# Patient Record
Sex: Female | Born: 1980 | Race: White | Hispanic: No | Smoking: Current every day smoker
Health system: Southern US, Community
[De-identification: ages and names within clinical notes are randomized; demographics above are authoritative.]

## PROBLEM LIST (undated history)

## (undated) DIAGNOSIS — B019 Varicella without complication: Secondary | ICD-10-CM

## (undated) DIAGNOSIS — G43909 Migraine, unspecified, not intractable, without status migrainosus: Secondary | ICD-10-CM

## (undated) DIAGNOSIS — Z1371 Encounter for nonprocreative screening for genetic disease carrier status: Secondary | ICD-10-CM

## (undated) DIAGNOSIS — Z8041 Family history of malignant neoplasm of ovary: Secondary | ICD-10-CM

## (undated) DIAGNOSIS — R011 Cardiac murmur, unspecified: Secondary | ICD-10-CM

## (undated) DIAGNOSIS — Z8481 Family history of carrier of genetic disease: Secondary | ICD-10-CM

## (undated) HISTORY — PX: TUBAL LIGATION: SHX77

## (undated) HISTORY — DX: Cardiac murmur, unspecified: R01.1

## (undated) HISTORY — DX: Varicella without complication: B01.9

## (undated) HISTORY — DX: Migraine, unspecified, not intractable, without status migrainosus: G43.909

## (undated) HISTORY — DX: Family history of malignant neoplasm of ovary: Z80.41

## (undated) HISTORY — DX: Family history of carrier of genetic disease: Z84.81

---
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
## 2004-11-11 ENCOUNTER — Observation Stay: Payer: Self-pay | Admitting: Obstetrics & Gynecology

## 2005-01-03 ENCOUNTER — Inpatient Hospital Stay: Payer: Self-pay

## 2005-01-03 HISTORY — PX: TUBAL LIGATION: SHX77

## 2011-06-01 ENCOUNTER — Emergency Department: Payer: Self-pay | Admitting: *Deleted

## 2012-02-27 ENCOUNTER — Emergency Department: Payer: Self-pay | Admitting: Emergency Medicine

## 2012-02-27 LAB — URINALYSIS, COMPLETE
Ketone: NEGATIVE
Ph: 5 (ref 4.5–8.0)
Protein: 30
RBC,UR: 5 /HPF (ref 0–5)
Specific Gravity: 1.023 (ref 1.003–1.030)
Squamous Epithelial: 9

## 2012-03-12 ENCOUNTER — Emergency Department: Payer: Self-pay | Admitting: *Deleted

## 2012-11-21 ENCOUNTER — Emergency Department: Payer: Self-pay | Admitting: Emergency Medicine

## 2013-07-17 ENCOUNTER — Emergency Department: Payer: Self-pay | Admitting: Emergency Medicine

## 2013-10-12 ENCOUNTER — Emergency Department: Payer: Self-pay | Admitting: Internal Medicine

## 2013-10-12 LAB — COMPREHENSIVE METABOLIC PANEL
ALK PHOS: 52 U/L
AST: 23 U/L (ref 15–37)
Albumin: 4.4 g/dL (ref 3.4–5.0)
Anion Gap: 4 — ABNORMAL LOW (ref 7–16)
BILIRUBIN TOTAL: 0.7 mg/dL (ref 0.2–1.0)
BUN: 13 mg/dL (ref 7–18)
Calcium, Total: 8.8 mg/dL (ref 8.5–10.1)
Chloride: 105 mmol/L (ref 98–107)
Co2: 31 mmol/L (ref 21–32)
Creatinine: 0.53 mg/dL — ABNORMAL LOW (ref 0.60–1.30)
EGFR (African American): >60
EGFR (Non-African Amer.): >60
GLUCOSE: 92 mg/dL (ref 65–99)
OSMOLALITY: 279 (ref 275–301)
Potassium: 3.1 mmol/L — ABNORMAL LOW (ref 3.5–5.1)
SGPT (ALT): 16 U/L (ref 12–78)
SODIUM: 140 mmol/L (ref 136–145)
TOTAL PROTEIN: 7.7 g/dL (ref 6.4–8.2)

## 2013-10-12 LAB — CBC
HCT: 42.3 % (ref 35.0–47.0)
HGB: 14.2 g/dL (ref 12.0–16.0)
MCH: 30.1 pg (ref 26.0–34.0)
MCHC: 33.5 g/dL (ref 32.0–36.0)
MCV: 90 fL (ref 80–100)
Platelet: 175 10*3/uL (ref 150–440)
RBC: 4.71 10*6/uL (ref 3.80–5.20)
RDW: 13 % (ref 11.5–14.5)
WBC: 7.4 10*3/uL (ref 3.6–11.0)

## 2013-10-12 LAB — URINALYSIS, COMPLETE
BILIRUBIN, UR: NEGATIVE
Bacteria: NONE SEEN
Blood: NEGATIVE
GLUCOSE, UR: NEGATIVE mg/dL (ref 0–75)
Ketone: NEGATIVE
Leukocyte Esterase: NEGATIVE
NITRITE: NEGATIVE
PROTEIN: NEGATIVE
Ph: 6 (ref 4.5–8.0)
RBC,UR: 1 /HPF (ref 0–5)
SPECIFIC GRAVITY: 1.019 (ref 1.003–1.030)
Squamous Epithelial: 6

## 2013-10-12 LAB — LIPASE, BLOOD: Lipase: 118 U/L (ref 73–393)

## 2015-07-12 ENCOUNTER — Encounter: Payer: Self-pay | Admitting: Family Medicine

## 2015-07-12 ENCOUNTER — Ambulatory Visit (INDEPENDENT_AMBULATORY_CARE_PROVIDER_SITE_OTHER): Payer: BLUE CROSS/BLUE SHIELD | Admitting: Family Medicine

## 2015-07-12 VITALS — BP 102/64 | HR 84 | Temp 98.2°F | Ht 65.25 in | Wt 148.5 lb

## 2015-07-12 DIAGNOSIS — R5383 Other fatigue: Secondary | ICD-10-CM

## 2015-07-12 DIAGNOSIS — Z720 Tobacco use: Secondary | ICD-10-CM

## 2015-07-12 DIAGNOSIS — Z13220 Encounter for screening for lipoid disorders: Secondary | ICD-10-CM | POA: Diagnosis not present

## 2015-07-12 DIAGNOSIS — R1084 Generalized abdominal pain: Secondary | ICD-10-CM

## 2015-07-12 DIAGNOSIS — R109 Unspecified abdominal pain: Secondary | ICD-10-CM | POA: Insufficient documentation

## 2015-07-12 DIAGNOSIS — Z0000 Encounter for general adult medical examination without abnormal findings: Secondary | ICD-10-CM

## 2015-07-12 LAB — COMPREHENSIVE METABOLIC PANEL
ALBUMIN: 4.5 g/dL (ref 3.5–5.2)
ALT: 8 U/L (ref 0–35)
AST: 13 U/L (ref 0–37)
Alkaline Phosphatase: 39 U/L (ref 39–117)
BUN: 12 mg/dL (ref 6–23)
CALCIUM: 9.4 mg/dL (ref 8.4–10.5)
CHLORIDE: 106 meq/L (ref 96–112)
CO2: 29 mEq/L (ref 19–32)
Creatinine, Ser: 0.75 mg/dL (ref 0.40–1.20)
GFR: 94.92 mL/min (ref >60.00)
Glucose, Bld: 80 mg/dL (ref 70–99)
POTASSIUM: 4 meq/L (ref 3.5–5.1)
SODIUM: 141 meq/L (ref 135–145)
TOTAL PROTEIN: 7.1 g/dL (ref 6.0–8.3)
Total Bilirubin: 0.7 mg/dL (ref 0.2–1.2)

## 2015-07-12 LAB — CBC
HEMATOCRIT: 43.5 % (ref 36.0–46.0)
HEMOGLOBIN: 14.5 g/dL (ref 12.0–15.0)
MCHC: 33.3 g/dL (ref 30.0–36.0)
MCV: 90.5 fl (ref 78.0–100.0)
PLATELETS: 163 10*3/uL (ref 150.0–400.0)
RBC: 4.82 Mil/uL (ref 3.87–5.11)
RDW: 12.9 % (ref 11.5–15.5)
WBC: 5.5 10*3/uL (ref 4.0–10.5)

## 2015-07-12 LAB — TSH: TSH: 1.3 u[IU]/mL (ref 0.35–4.50)

## 2015-07-12 LAB — VITAMIN B12: VITAMIN B 12: 180 pg/mL — AB (ref 211–911)

## 2015-07-12 LAB — LIPID PANEL
CHOLESTEROL: 154 mg/dL (ref 0–200)
HDL: 40.3 mg/dL (ref >39.00)
LDL Cholesterol: 103 mg/dL — ABNORMAL HIGH (ref 0–99)
NonHDL: 113.48
TRIGLYCERIDES: 54 mg/dL (ref 0.0–149.0)
Total CHOL/HDL Ratio: 4
VLDL: 10.8 mg/dL (ref 0.0–40.0)

## 2015-07-12 MED ORDER — DICYCLOMINE HCL 20 MG PO TABS: 20.0000 mg | ORAL_TABLET | Freq: Four times a day (QID) | ORAL | Status: DC | PRN

## 2015-07-12 NOTE — Assessment & Plan Note (Signed)
Declines flu. Will obtain records regarding tetanus. Labs today. See orders. Pap smear up-to-date.

## 2015-07-12 NOTE — Assessment & Plan Note (Signed)
Unremarkable exam. No red flags. Obtaining labs to assess for organic pathology.

## 2015-07-12 NOTE — Assessment & Plan Note (Signed)
History consistent with IBS. Treating with dicyclomine.

## 2015-07-12 NOTE — Progress Notes (Signed)
Pre visit review using our clinic review tool, if applicable. No additional management support is needed unless otherwise documented below in the visit note. 

## 2015-07-12 NOTE — Patient Instructions (Signed)
Use the medication (Dicyclomine) as needed for your belly pain.  Use Miralax daily to help regulate your bowel movements. You may increase this to twice daily (or more) if needed to achieve a regular soft BM.  We will call with your lab results.  Follow up in 6 months to a year or earlier if needed.  Take care  Dr. Adriana Simas

## 2015-07-12 NOTE — Progress Notes (Signed)
Subjective:  Patient ID: Shelia [name], female    DOB: 04-13-1981  Age: 35 y.o. MRN: [id number]  CC: Establish care, Abdominal pain, Fatigue  HPI Shelia [name] is a 35 y.o. female presents to the clinic today to establish care.  Preventative Healthcare  Pap smear: Up to date; 2015 or 2016 (followed by Gyn).  Immunizations  Tetanus - Unsure of last tetanus. Will obtain records.  Pneumococcal - In need of given tobacco abuse.   Flu - Declines.  Labs: Labs today; see orders.  Alcohol use: No.  Smoking/tobacco use: Yes.   STD/HIV testing: Declines.  Abdominal pain  This is a chronic problem.  She's had generalized abdominal pain for over a year.  Pain is severe and intermittent. Pain is brief and occurs several times during the day.  She describes it as "contractions".  She reports that she has intermittent difficulty with constipation.  She has been evaluated for this previously and has had negative workup (Labs, CT, [place]).  No known exacerbating or relieving factors.  No associated fever, chills, weight loss.  Fatigue  Patient has noted significant fatigue the past month.  He states that she sleeps well but does not wake up feeling refreshed.  No new stressors.  She's unsure why she is tired all the time.  No exacerbating or relieving factors.  PMH, Surgical Hx, Family Hx, Social History reviewed and updated as below.  Past Medical History  Diagnosis Date  . Chicken pox   . Heart murmur     as a child.  Marland Kitchen Migraines    Past Surgical History  Procedure Laterality Date  . Tubal ligation     Family History  Problem Relation Age of Onset  . Alcohol abuse Father   . Hypertension Father   . Diabetes Maternal Aunt   . Diabetes Maternal Grandmother   . Hypertension Mother   . Ovarian cancer Paternal Grandmother    Social History  Substance Use Topics  . Smoking status: Current Every Day Smoker  . Smokeless tobacco: Never Used  .  Alcohol Use: No   Review of Systems  HENT: Positive for tinnitus.   Eyes: Positive for itching.  Gastrointestinal: Positive for nausea and abdominal pain.  Genitourinary: Positive for frequency.  Neurological: Positive for headaches.  Psychiatric/Behavioral:       Anxiety.   All other systems reviewed and are negative.  Objective:   Today's Vitals: BP 102/64 mmHg  Pulse 84  Temp(Src) 98.2 F (36.8 C) (Oral)  Ht 5' 5.25" (1.657 m)  Wt 148 lb 8 oz (67.359 kg)  BMI 24.53 kg/m2  SpO2 100%  LMP 07/02/2015  Physical Exam  Constitutional: She is oriented to person, place, and time. She appears well-developed and well-nourished. No distress.  HENT:  Head: Normocephalic and atraumatic.  Nose: Nose normal.  Mouth/Throat: Oropharynx is clear and moist. No oropharyngeal exudate.  Normal TM's bilaterally.   Eyes: Conjunctivae are normal. No scleral icterus.  Neck: Neck supple. No thyromegaly present.  Cardiovascular: Normal rate and regular rhythm.   Soft systolic murmur.  Pulmonary/Chest: Effort normal and breath sounds normal. She has no wheezes. She has no rales.  Abdominal: Soft. She exhibits no distension. There is no tenderness. There is no rebound and no guarding.  Musculoskeletal: Normal range of motion. She exhibits no edema.  Lymphadenopathy:    She has no cervical adenopathy.  Neurological: She is alert and oriented to person, place, and time.  Skin: Skin is warm and dry.  Psychiatric: She has a normal mood and affect.  Vitals reviewed.  Assessment & Plan:   Problem List Items Addressed This Visit    Abdominal pain    History consistent with IBS. Treating with dicyclomine.      Relevant Medications   dicyclomine (BENTYL) 20 MG tablet   Other Relevant Orders   CBC   Comp Met (CMET)   Fatigue    Unremarkable exam. No red flags. Obtaining labs to assess for organic pathology.      Relevant Orders   TSH   B12   Preventative health care - Primary     Declines flu. Will obtain records regarding tetanus. Labs today. See orders. Pap smear up-to-date.      Tobacco abuse    Other Visit Diagnoses    Screening, lipid        Relevant Orders    Lipid panel       Outpatient Encounter Prescriptions as of 07/12/2015  Medication Sig  . dicyclomine (BENTYL) 20 MG tablet Take 1 tablet (20 mg total) by mouth 4 (four) times daily as needed for spasms.   No facility-administered encounter medications on file as of 07/12/2015.    Follow-up: Return 6 months to 1 year or earlier if needed.  [place]

## 2015-07-22 ENCOUNTER — Telehealth: Payer: Self-pay | Admitting: Family Medicine

## 2015-07-22 NOTE — Telephone Encounter (Signed)
[id number]

## 2015-07-22 NOTE — Telephone Encounter (Signed)
Pt called to check the status for the lab work. Call pt @ [phone] Mon-Fri call between 1-2 and Friday call anytime. Thank you!

## 2015-07-26 ENCOUNTER — Ambulatory Visit (INDEPENDENT_AMBULATORY_CARE_PROVIDER_SITE_OTHER): Payer: BLUE CROSS/BLUE SHIELD

## 2015-07-26 ENCOUNTER — Telehealth: Payer: Self-pay

## 2015-07-26 DIAGNOSIS — E538 Deficiency of other specified B group vitamins: Secondary | ICD-10-CM | POA: Diagnosis not present

## 2015-07-26 MED ORDER — CYANOCOBALAMIN 1000 MCG/ML IJ SOLN
1000.0000 ug | Freq: Once | INTRAMUSCULAR | Status: AC
  Administered 2015-07-26: 1000 ug via INTRAMUSCULAR

## 2015-07-26 NOTE — Telephone Encounter (Signed)
Based off the history given, it sounds like a tension headache. Advise heat and Motrin 800 mg three times daily if needed.

## 2015-07-26 NOTE — Progress Notes (Signed)
Patient came in for B12 injection.  Received in Right deltoid.  Patient tolerated well.  

## 2015-07-26 NOTE — Telephone Encounter (Signed)
Reason for call: Headaches Symptoms: pt c/o headaches, pt states that they hit her at anytime of day and they are primarily start from the fron of her head and move to the back of head Duration: all day x1week Medications: tyelnol 400-800mg  once a day, advil  once a day Last seen for this problem: January Seen by: Dr. Adriana Simas  Pt wants to know if she can get script for something to help with the headaches. Pt is coming in for B12 injection at 2:00pm. Please advise thanks

## 2015-07-26 NOTE — Telephone Encounter (Signed)
Tanya notified pt of Dr.Cook's comments

## 2015-08-02 ENCOUNTER — Ambulatory Visit (INDEPENDENT_AMBULATORY_CARE_PROVIDER_SITE_OTHER): Payer: BLUE CROSS/BLUE SHIELD

## 2015-08-02 DIAGNOSIS — E538 Deficiency of other specified B group vitamins: Secondary | ICD-10-CM

## 2015-08-02 MED ORDER — CYANOCOBALAMIN 1000 MCG/ML IJ SOLN
1000.0000 ug | Freq: Once | INTRAMUSCULAR | Status: AC
  Administered 2015-08-02: 1000 ug via INTRAMUSCULAR

## 2015-08-02 NOTE — Progress Notes (Signed)
Patient came in for B12 injection.  Received in Left deltoid.  Patient tolerated well.  

## 2015-08-09 ENCOUNTER — Ambulatory Visit (INDEPENDENT_AMBULATORY_CARE_PROVIDER_SITE_OTHER): Payer: BLUE CROSS/BLUE SHIELD

## 2015-08-09 DIAGNOSIS — E538 Deficiency of other specified B group vitamins: Secondary | ICD-10-CM

## 2015-08-09 MED ORDER — CYANOCOBALAMIN 1000 MCG/ML IJ SOLN
1000.0000 ug | Freq: Once | INTRAMUSCULAR | Status: AC
  Administered 2015-08-09: 1000 ug via INTRAMUSCULAR

## 2015-08-09 NOTE — Progress Notes (Signed)
Patient came in for b12 injection.  Received in her right deltoid.  Patient tolerated well.

## 2015-08-30 ENCOUNTER — Encounter: Payer: Self-pay | Admitting: Internal Medicine

## 2015-08-30 ENCOUNTER — Ambulatory Visit (INDEPENDENT_AMBULATORY_CARE_PROVIDER_SITE_OTHER): Payer: BLUE CROSS/BLUE SHIELD | Admitting: Family Medicine

## 2015-08-30 DIAGNOSIS — J020 Streptococcal pharyngitis: Secondary | ICD-10-CM | POA: Diagnosis not present

## 2015-08-30 LAB — POCT RAPID STREP A (OFFICE): Rapid Strep A Screen: POSITIVE — AB

## 2015-08-30 MED ORDER — AMOXICILLIN 500 MG PO CAPS: 500.0000 mg | ORAL_CAPSULE | Freq: Two times a day (BID) | ORAL | Status: DC

## 2015-08-30 NOTE — Patient Instructions (Addendum)
This is viral.  Use OTC tylenol and/or ibuprofen for discomfort.  Increase fluid intake.  Take the medication as prescribed.  Follow up as needed  Take care  Dr. Adriana Simasook   Pharyngitis Pharyngitis is redness, pain, and swelling (inflammation) of your pharynx.  CAUSES  Pharyngitis is usually caused by infection. Most of the time, these infections are from viruses (viral) and are part of a cold. However, sometimes pharyngitis is caused by bacteria (bacterial). Pharyngitis can also be caused by allergies. Viral pharyngitis may be spread from person to person by coughing, sneezing, and personal items or utensils (cups, forks, spoons, toothbrushes). Bacterial pharyngitis may be spread from person to person by more intimate contact, such as kissing.  SIGNS AND SYMPTOMS  Symptoms of pharyngitis include:   Sore throat.   Tiredness (fatigue).   Low-grade fever.   Headache.  Joint pain and muscle aches.  Skin rashes.  Swollen lymph nodes.  Plaque-like film on throat or tonsils (often seen with bacterial pharyngitis). DIAGNOSIS  Your health care provider will ask you questions about your illness and your symptoms. Your medical history, along with a physical exam, is often all that is needed to diagnose pharyngitis. Sometimes, a rapid strep test is done. Other lab tests may also be done, depending on the suspected cause.  TREATMENT  Viral pharyngitis will usually get better in 3-4 days without the use of medicine. Bacterial pharyngitis is treated with medicines that kill germs (antibiotics).  HOME CARE INSTRUCTIONS   Drink enough water and fluids to keep your urine clear or pale yellow.   Only take over-the-counter or prescription medicines as directed by your health care provider:   If you are prescribed antibiotics, make sure you finish them even if you start to feel better.   Do not take aspirin.   Get lots of rest.   Gargle with 8 oz of salt water ( tsp of salt per 1  qt of water) as often as every 1-2 hours to soothe your throat.   Throat lozenges (if you are not at risk for choking) or sprays may be used to soothe your throat. SEEK MEDICAL CARE IF:   You have large, tender lumps in your neck.  You have a rash.  You cough up green, yellow-brown, or bloody spit. SEEK IMMEDIATE MEDICAL CARE IF:   Your neck becomes stiff.  You drool or are unable to swallow liquids.  You vomit or are unable to keep medicines or liquids down.  You have severe pain that does not go away with the use of recommended medicines.  You have trouble breathing (not caused by a stuffy nose). MAKE SURE YOU:   Understand these instructions.  Will watch your condition.  Will get help right away if you are not doing well or get worse.   This information is not intended to replace advice given to you by your health care provider. Make sure you discuss any questions you have with your health care provider.   Document Released: 06/01/2005 Document Revised: 03/22/2013 Document Reviewed: 02/06/2013 Elsevier Interactive Patient Education [employer]2016 Elsevier Inc.

## 2015-08-30 NOTE — Progress Notes (Signed)
Pre visit review using our clinic review tool, if applicable. No additional management support is needed unless otherwise documented below in the visit note. 

## 2015-08-30 NOTE — Progress Notes (Signed)
   Subjective:  Patient ID: Shelia [name], female    DOB: 01/29/1981  Age: 35 y.o. MRN: [id number]  CC: Sore throat, runny nose, sneezing  HPI:  35 year old female presents to clinic today with the above complaints.  Patient states that she's not been feeling well since Sunday. She been experiencing sore throat, sneezing, runny nose, and rash. She currently does not have a rash. Sore throat has improved slightly but still remains problematic. No known exacerbating or relieving factors. No associated fevers or chills. She has had recent sick contacts as her son has had influenza she's also been exposed to strep throat. She is concerned she may have strep throat.  Social Hx   Social History   Social History  . Marital Status: Single    Spouse Name: N/A  . Number of Children: N/A  . Years of Education: N/A   Social History Main Topics  . Smoking status: Current Every Day Smoker  . Smokeless tobacco: Never Used  . Alcohol Use: No  . Drug Use: No  . Sexual Activity:    Partners: Male   Other Topics Concern  . None   Social History Narrative   Review of Systems  Constitutional: Negative.   HENT: Positive for rhinorrhea, sneezing and sore throat.    Objective:  BP 111/76 mmHg  Pulse 90  Temp(Src) 98.1 F (36.7 C) (Oral)  Ht 5' 5.25" (1.657 m)  Wt 151 lb 6 oz (68.663 kg)  BMI 25.01 kg/m2  SpO2 100%  BP/Weight 08/30/2015 07/12/2015  Systolic BP 111 102  Diastolic BP 76 64  Wt. (Lbs) 151.38 148.5  BMI 25.01 24.53   Physical Exam  Constitutional: She is oriented to person, place, and time. She appears well-developed. No distress.  HENT:  Head: Normocephalic and atraumatic.  Oropharynx with mild erythema. No exudate. Normal TM's bilaterally.   Cardiovascular: Normal rate and regular rhythm.   Pulmonary/Chest: Effort normal and breath sounds normal. No respiratory distress. She has no wheezes. She has no rales.  Neurological: She is alert and oriented to person,  place, and time.  Skin: Skin is warm and dry. No rash noted.  Psychiatric: She has a normal mood and affect.  Vitals reviewed.  Lab Results  Component Value Date   WBC 5.5 07/12/2015   HGB 14.5 07/12/2015   HCT 43.5 07/12/2015   PLT 163.0 07/12/2015   GLUCOSE 80 07/12/2015   CHOL 154 07/12/2015   TRIG 54.0 07/12/2015   HDL 40.30 07/12/2015   LDLCALC 103* 07/12/2015   ALT 8 07/12/2015   AST 13 07/12/2015   NA 141 07/12/2015   K 4.0 07/12/2015   CL 106 07/12/2015   CREATININE 0.75 07/12/2015   BUN 12 07/12/2015   CO2 29 07/12/2015   TSH 1.30 07/12/2015    Assessment & Plan:   Problem List Items Addressed This Visit    Strep pharyngitis    New problem. Rapid strep positive today. Treating with amoxicillin.      Relevant Medications   amoxicillin (AMOXIL) 500 MG capsule   Other Relevant Orders   POCT rapid strep A (Completed)      Meds ordered this encounter  Medications  . amoxicillin (AMOXIL) 500 MG capsule    Sig: Take 1 capsule (500 mg total) by mouth 2 (two) times daily.    Dispense:  20 capsule    Refill:  0   Follow-up: PRN  Everlene OtherJayce Jonai Weyland DO [facility] [place]

## 2015-08-30 NOTE — Assessment & Plan Note (Signed)
New problem. Rapid strep positive today. Treating with amoxicillin. 

## 2015-09-06 ENCOUNTER — Ambulatory Visit (INDEPENDENT_AMBULATORY_CARE_PROVIDER_SITE_OTHER): Payer: BLUE CROSS/BLUE SHIELD

## 2015-09-06 DIAGNOSIS — E538 Deficiency of other specified B group vitamins: Secondary | ICD-10-CM | POA: Diagnosis not present

## 2015-09-06 MED ORDER — CYANOCOBALAMIN 1000 MCG/ML IJ SOLN
1000.0000 ug | Freq: Once | INTRAMUSCULAR | Status: AC
  Administered 2015-09-06: 1000 ug via INTRAMUSCULAR

## 2015-09-06 NOTE — Progress Notes (Signed)
Patient came in for b12 injection.  Patient received in Left deltoid.  Patient tolerated well.  

## 2015-10-30 ENCOUNTER — Telehealth: Payer: Self-pay | Admitting: Family Medicine

## 2015-10-30 NOTE — Telephone Encounter (Signed)
Pt called wanting to get a B12 injection on Friday. Pt states she can be reached Between 1-2 or after 5. @ [phone]. Thank you!

## 2015-10-30 NOTE — Telephone Encounter (Signed)
Tried to call her today, unable to leave a message, I scheduled her for Friday in a random slot, please get time from her so I can update it. thanks

## 2015-11-01 ENCOUNTER — Ambulatory Visit (INDEPENDENT_AMBULATORY_CARE_PROVIDER_SITE_OTHER): Payer: BLUE CROSS/BLUE SHIELD

## 2015-11-01 DIAGNOSIS — Z23 Encounter for immunization: Secondary | ICD-10-CM

## 2015-11-01 DIAGNOSIS — E538 Deficiency of other specified B group vitamins: Secondary | ICD-10-CM

## 2015-11-01 MED ORDER — CYANOCOBALAMIN 1000 MCG/ML IJ SOLN
1000.0000 ug | Freq: Once | INTRAMUSCULAR | Status: AC
  Administered 2015-11-01: 1000 ug via INTRAMUSCULAR

## 2015-11-01 MED ORDER — TETANUS-DIPHTH-ACELL PERTUSSIS 5-2.5-18.5 LF-MCG/0.5 IM SUSP
0.5000 mL | Freq: Once | INTRAMUSCULAR | Status: AC
  Administered 2015-11-01: 0.5 mL via INTRAMUSCULAR

## 2015-11-01 NOTE — Progress Notes (Signed)
Patient came in for B12 and a tdap injection.  Received  tdap in left deltoid, b12 in right deltoid.  Patient tolerated well.

## 2015-12-06 ENCOUNTER — Ambulatory Visit: Payer: BLUE CROSS/BLUE SHIELD

## 2016-08-08 ENCOUNTER — Encounter: Payer: Self-pay | Admitting: Emergency Medicine

## 2016-08-08 ENCOUNTER — Emergency Department
Admission: EM | Admit: 2016-08-08 | Discharge: 2016-08-08 | Disposition: A | Discharge status: Home / Self Care | Payer: BLUE CROSS/BLUE SHIELD | Attending: Emergency Medicine | Admitting: Emergency Medicine

## 2016-08-08 ENCOUNTER — Emergency Department: Payer: BLUE CROSS/BLUE SHIELD

## 2016-08-08 DIAGNOSIS — N390 Urinary tract infection, site not specified: Secondary | ICD-10-CM | POA: Insufficient documentation

## 2016-08-08 DIAGNOSIS — R319 Hematuria, unspecified: Secondary | ICD-10-CM

## 2016-08-08 DIAGNOSIS — F17200 Nicotine dependence, unspecified, uncomplicated: Secondary | ICD-10-CM | POA: Insufficient documentation

## 2016-08-08 DIAGNOSIS — N23 Unspecified renal colic: Secondary | ICD-10-CM | POA: Insufficient documentation

## 2016-08-08 DIAGNOSIS — Z79899 Other long term (current) drug therapy: Secondary | ICD-10-CM | POA: Insufficient documentation

## 2016-08-08 DIAGNOSIS — N200 Calculus of kidney: Secondary | ICD-10-CM | POA: Insufficient documentation

## 2016-08-08 LAB — BASIC METABOLIC PANEL
Anion gap: 7 (ref 5–15)
BUN: 16 mg/dL (ref 6–20)
CO2: 27 mmol/L (ref 22–32)
CREATININE: 0.74 mg/dL (ref 0.44–1.00)
Calcium: 8.9 mg/dL (ref 8.9–10.3)
Chloride: 105 mmol/L (ref 101–111)
GFR calc Af Amer: >60 mL/min (ref >60)
GLUCOSE: 111 mg/dL — AB (ref 65–99)
Potassium: 3.5 mmol/L (ref 3.5–5.1)
Sodium: 139 mmol/L (ref 135–145)

## 2016-08-08 LAB — CBC
HCT: 40.3 % (ref 35.0–47.0)
Hemoglobin: 14 g/dL (ref 12.0–16.0)
MCH: 30.6 pg (ref 26.0–34.0)
MCHC: 34.8 g/dL (ref 32.0–36.0)
MCV: 87.9 fL (ref 80.0–100.0)
PLATELETS: 199 10*3/uL (ref 150–440)
RBC: 4.58 MIL/uL (ref 3.80–5.20)
RDW: 13 % (ref 11.5–14.5)
WBC: 9.5 10*3/uL (ref 3.6–11.0)

## 2016-08-08 LAB — URINALYSIS, ROUTINE W REFLEX MICROSCOPIC
Bilirubin Urine: NEGATIVE
Glucose, UA: NEGATIVE mg/dL
Ketones, ur: NEGATIVE mg/dL
Leukocytes, UA: NEGATIVE
Nitrite: NEGATIVE
PH: 9 — AB (ref 5.0–8.0)
Protein, ur: NEGATIVE mg/dL
SPECIFIC GRAVITY, URINE: 1.017 (ref 1.005–1.030)
WBC, UA: NONE SEEN WBC/hpf (ref 0–5)

## 2016-08-08 LAB — POCT PREGNANCY, URINE: Preg Test, Ur: NEGATIVE

## 2016-08-08 MED ORDER — KETOROLAC TROMETHAMINE 30 MG/ML IJ SOLN
30.0000 mg | Freq: Once | INTRAMUSCULAR | Status: AC
  Administered 2016-08-08: 30 mg via INTRAVENOUS
  Filled 2016-08-08: qty 1

## 2016-08-08 MED ORDER — CEPHALEXIN 500 MG PO CAPS: 500.0000 mg | ORAL_CAPSULE | Freq: Two times a day (BID) | ORAL | 0 refills | Status: DC

## 2016-08-08 MED ORDER — DEXTROSE 5 % IV SOLN: 1.0000 g | Freq: Once | INTRAVENOUS | Status: DC

## 2016-08-08 MED ORDER — CEFTRIAXONE SODIUM-DEXTROSE 1-3.74 GM-% IV SOLR
INTRAVENOUS | Status: AC
  Administered 2016-08-08: 1000 mg
  Filled 2016-08-08: qty 50

## 2016-08-08 MED ORDER — CEFTRIAXONE SODIUM-DEXTROSE 1-3.74 GM-% IV SOLR
1.0000 g | Freq: Once | INTRAVENOUS | Status: AC
  Administered 2016-08-08: 1 g via INTRAVENOUS

## 2016-08-08 NOTE — ED Triage Notes (Signed)
Pt to ed with c/o abd pain and lower back pain, acute onset.

## 2016-08-08 NOTE — ED Triage Notes (Signed)
C/o left flank and LLQ pain. Has had nausea and diarrhea. Denies fevers. Denies hematuria. Appears in pain

## 2016-08-08 NOTE — ED Provider Notes (Signed)
[facility] Emergency Department Provider Note ____________________________________________   I have reviewed the triage vital signs and the triage nursing note.  HISTORY  Chief Complaint Abdominal Pain   Historian Patient  HPI Shelia [name] is a 36 y.o. female presenting with an hour and a half of acute onset left flank pain radiating into the left lower quadrant. Denies frequency of urination or hematuria. Pain was severe. Just prior to my walking in to see the patient, pain was starting to ease off, and she just received a verbal order of probable completed, and patient is now almost pain-free.  Denies any new vaginal discharge. Denies any pelvic or abdominal pain, other than the radiation at the time when the pain was at its worst. No prior history of kidney stones, nor prior history of pain like this. States that she had a ovarian cyst at one point in the past, but doesn't feel like this.    Past Medical History:  Diagnosis Date  . Chicken pox   . Heart murmur    as a child.  . Migraines     Patient Active Problem List   Diagnosis Date Noted  . Strep pharyngitis 08/30/2015  . Preventative health care 07/12/2015  . Abdominal pain 07/12/2015  . Tobacco abuse 07/12/2015  . Fatigue 07/12/2015    Past Surgical History:  Procedure Laterality Date  . TUBAL LIGATION      Prior to Admission medications   Medication Sig Start Date End Date Taking? Authorizing Provider  cephALEXin (KEFLEX) 500 MG capsule Take 1 capsule (500 mg total) by mouth 2 (two) times daily. 08/08/16   Governor Rooks, MD  dicyclomine (BENTYL) 20 MG tablet Take 1 tablet (20 mg total) by mouth 4 (four) times daily as needed for spasms. Patient not taking: Reported on 08/08/2016 07/12/15   Tommie Sams, DO    Allergies  Allergen Reactions  . Other     Tingling with seafood  . Metronidazole Rash    Family History  Problem Relation Age of Onset  . Alcohol abuse Father   .  Hypertension Father   . Diabetes Maternal Aunt   . Diabetes Maternal Grandmother   . Hypertension Mother   . Ovarian cancer Paternal Grandmother     Social History Social History  Substance Use Topics  . Smoking status: Current Every Day Smoker  . Smokeless tobacco: Never Used  . Alcohol use No    Review of Systems  Constitutional: Negative for fever. Eyes: Negative for visual changes. ENT: Negative for sore throat. Cardiovascular: Negative for chest pain. Respiratory: Negative for shortness of breath. Gastrointestinal: Negative for vomiting and diarrhea. Genitourinary: Negative for dysuria. Musculoskeletal: Negative for extremity pain. Skin: Negative for rash. Neurological: Negative for headache. 10 point Review of Systems otherwise negative ____________________________________________   PHYSICAL EXAM:  VITAL SIGNS: ED Triage Vitals  Enc Vitals Group     BP 08/08/16 1019 126/78     Pulse Rate 08/08/16 1019 82     Resp 08/08/16 1019 18     Temp 08/08/16 1019 98.9 F (37.2 C)     Temp Source 08/08/16 1019 Oral     SpO2 08/08/16 1019 100 %     Weight 08/08/16 1020 150 lb (68 kg)     Height 08/08/16 1020 5\' 5"  (1.651 m)     Head Circumference --      Peak Flow --      Pain Score 08/08/16 1020 9     Pain  Loc --      Pain Edu? --      Excl. in GC? --      Constitutional: Alert and oriented. Well appearing and in no distress. HEENT   Head: Normocephalic and atraumatic.      Eyes: Conjunctivae are normal. PERRL. Normal extraocular movements.      Ears:         Nose: No congestion/rhinnorhea.   Mouth/Throat: Mucous membranes are moist.   Neck: No stridor. Cardiovascular/Chest: Normal rate, regular rhythm.  No murmurs, rubs, or gallops. Respiratory: Normal respiratory effort without tachypnea nor retractions. Breath sounds are clear and equal bilaterally. No wheezes/rales/rhonchi. Gastrointestinal: Soft. No distention, no guarding, no rebound.  Nontender.  Genitourinary/rectal:Deferred Musculoskeletal: Nontender with normal range of motion in all extremities. No joint effusions.  No lower extremity tenderness.  No edema. Neurologic:  Normal speech and language. No gross or focal neurologic deficits are appreciated. Skin:  Skin is warm, dry and intact. No rash noted. Psychiatric: Mood and affect are normal. Speech and behavior are normal. Patient exhibits appropriate insight and judgment.   ____________________________________________  LABS (pertinent positives/negatives)  Labs Reviewed  BASIC METABOLIC PANEL - Abnormal; Notable for the following:       Result Value   Glucose, Bld 111 (*)    All other components within normal limits  URINALYSIS, ROUTINE W REFLEX MICROSCOPIC - Abnormal; Notable for the following:    Color, Urine YELLOW (*)    APPearance CLOUDY (*)    pH 9.0 (*)    Hgb urine dipstick SMALL (*)    Bacteria, UA MANY (*)    Squamous Epithelial / LPF 6-30 (*)    All other components within normal limits  URINE CULTURE  CBC  POC URINE PREG, ED  POCT PREGNANCY, URINE    ____________________________________________    EKG I, Governor Rooksebecca Gailen Venne, MD, the attending physician have personally viewed and interpreted all ECGs.  None ____________________________________________  RADIOLOGY All Xrays were viewed by me. Imaging interpreted by Radiologist.  CT stone study abdomen:  IMPRESSION: Minimal left perinephric stranding which may be related to infection or nonspecific inflammation. No evidence of hydronephrosis.  2.5 cm right ovarian cyst. __________________________________________  PROCEDURES  Procedure(s) performed: None  Critical Care performed: None  ____________________________________________   ED COURSE / ASSESSMENT AND PLAN  Pertinent labs & imaging results that were available during my care of the patient were reviewed by me and considered in my medical decision making (see chart for  details).   [name] came in with what sounds like of fairly severe left flank pain rating down to left lower quadrant. Based on the description clinically certainly sounds most like ureteral lithiasis/renal colic.  After Toradol, patient is much improved. I discussed with her the multiple ways to approach any further diagnostic investigation. We discussed risk and benefit of first time diagnosis of kidney stone via CT scan, weight against radiation exposure. We discussed the possibility of doing a renal ultrasound and watching and waiting given some improvement. Patient would like to proceed with CT scan.  Denies pelvic complaints, vaginal discharge, or tenderness on exam, and it certainly seems like PID or other pelvic etiology is less likely at this point in time. She is declining/deferring pelvic exam at this point in time which I think is reasonable.   CT scan does not show definitive hydro-or kidney stone, however there was perinephric stranding. When I questioned patient, she states that she did in fact urinated before she went to CT  scan. I am highly suspicious that she probably passed the kidney stone right as she got her Toradol, and then probably urinated out before she went to her CT scan.  In any case, given the possibility raised for a kidney infection, and bacteria on the urinalysis even though without white blood cells, leukocytes, or nitrites, I did discuss with the patient and she is to give one dose of Rocephin and send a urine culture. Patient will be sent home on Keflex.  We discussed that I would recommend a pelvic exam for further evaluation given there was not a certain determination based off of the rest of the workup although again my highest suspicion is kidney stone that is now passed.  Patient did not want to do a pelvic exam, and understands what to watch for. I think this is reasonable approach.    CONSULTATIONS:   None   Patient / Family / Caregiver informed  of clinical course, medical decision-making process, and agree with plan.   I discussed return precautions, follow-up instructions, and discharge instructions with patient and/or family.   ___________________________________________   FINAL CLINICAL IMPRESSION(S) / ED DIAGNOSES   Final diagnoses:  Ureteral colic  Urinary tract infection with hematuria, site unspecified  Kidney stone              Note: This dictation was prepared with Dragon dictation. Any transcriptional errors that result from this process are unintentional    Governor Rooks, MD 08/08/16 1306

## 2016-08-08 NOTE — Discharge Instructions (Signed)
You were evaluated for left-sided flank pain, and as we discussed, I'm suspicious that you passed a kidney stone while here in the emergency department. Your CT scan shows inflammation which could be due to a recently passed kidney stone, or possibly kidney/urinary tract infection. For the possibility of infection, you were given 24-hour dose of IV Rocephin in the emergency department. Please start the antibiotic tomorrow.  Return to the emergency room immediately for any worsening symptoms including pain, inability to urinate, vomiting, fever, dizziness or passing out, or any other symptoms concerning to you.

## 2016-08-10 LAB — URINE CULTURE

## 2017-03-30 IMAGING — CT CT RENAL STONE PROTOCOL
3 of 4 series · 8 of 46 positions shown, 15 images · non-contrast
Comparison: 10/12/2013 CT

CLINICAL DATA: 35-year-old female with acute left flank, abdominal
and pelvic pain.

EXAM:
CT ABDOMEN AND PELVIS WITHOUT CONTRAST
TECHNIQUE: Multidetector CT imaging of the abdomen and pelvis was performed
following the standard protocol without IV contrast.

[Series 5: lung bases · axial · 0.75mm/px · z∈[-20,+40]mm · 4 of 22 slices shown, 9 images]
[im 5/22  soft-tissue]
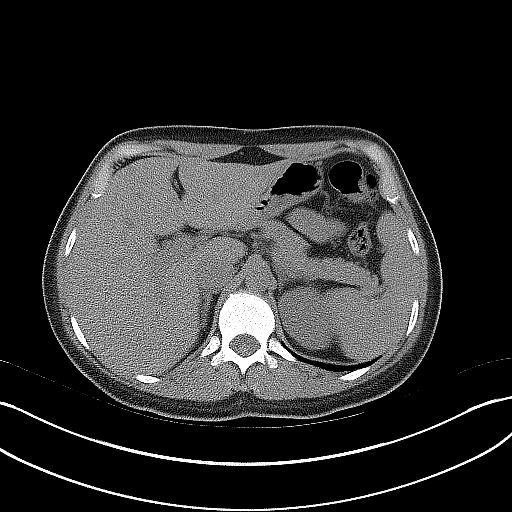
[im 5/22  lung]
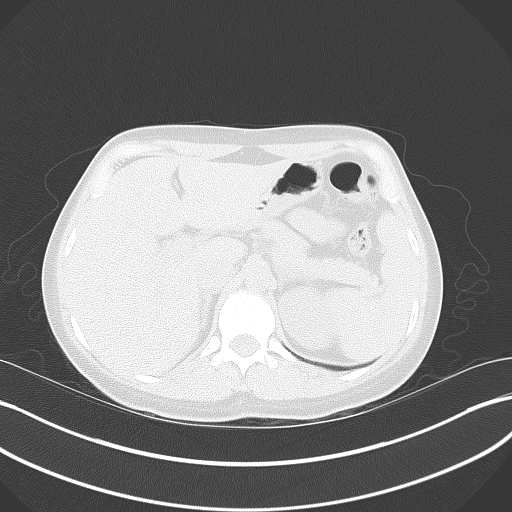
[im 5/22  bone]
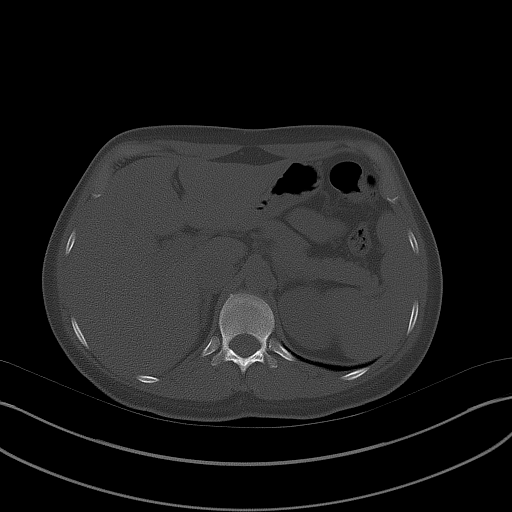
[im 9/22  soft-tissue]
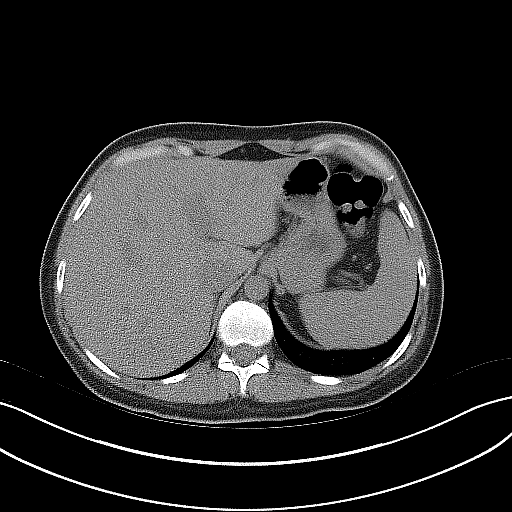
[im 9/22  lung]
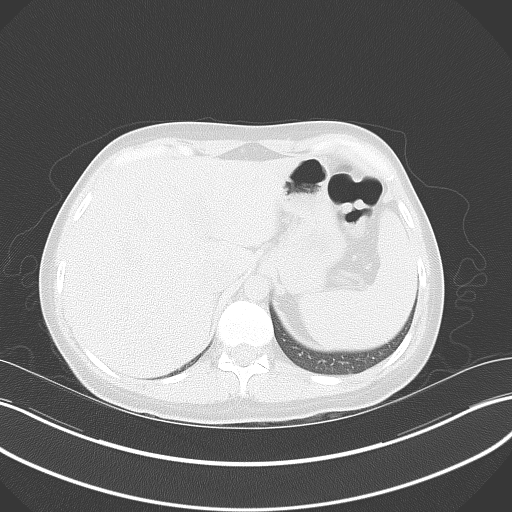
[im 13/22  soft-tissue]
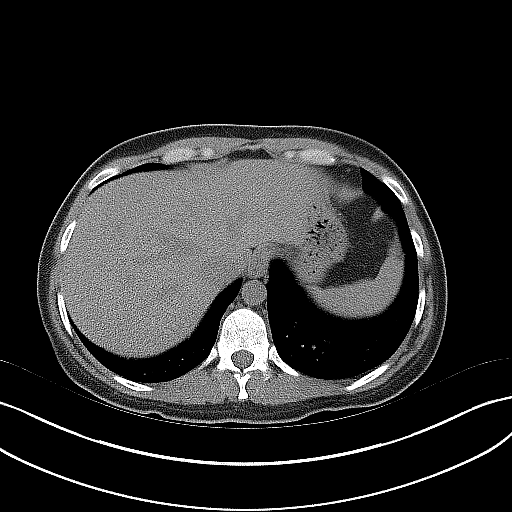
[im 13/22  lung]
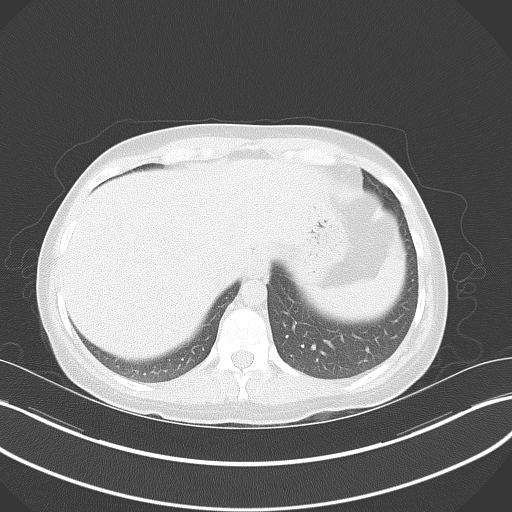
[im 17/22  soft-tissue]
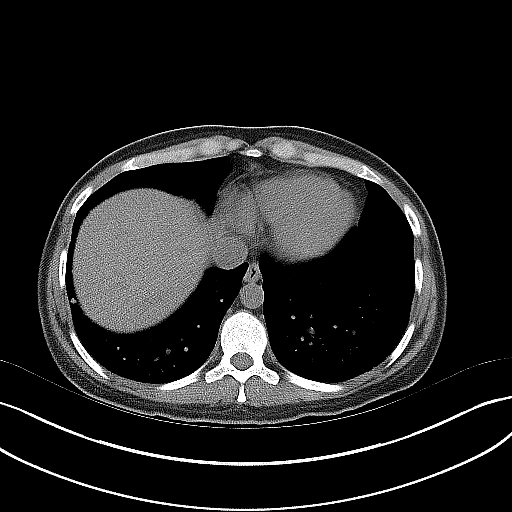
[im 17/22  lung]
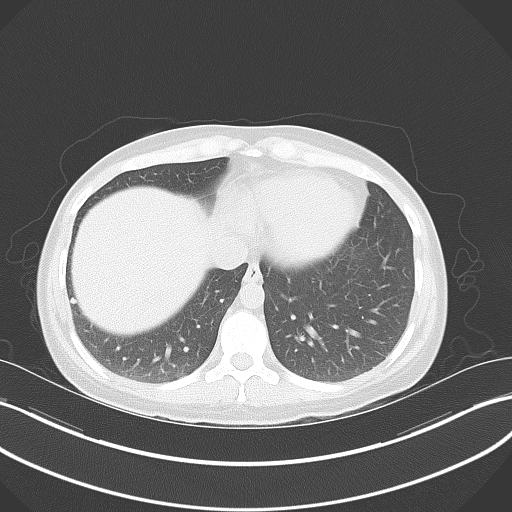

[Series 6: coronal · coronal · 0.76mm/px · 3 of 112 slices shown, 4 images]
[im 38/112  soft-tissue]
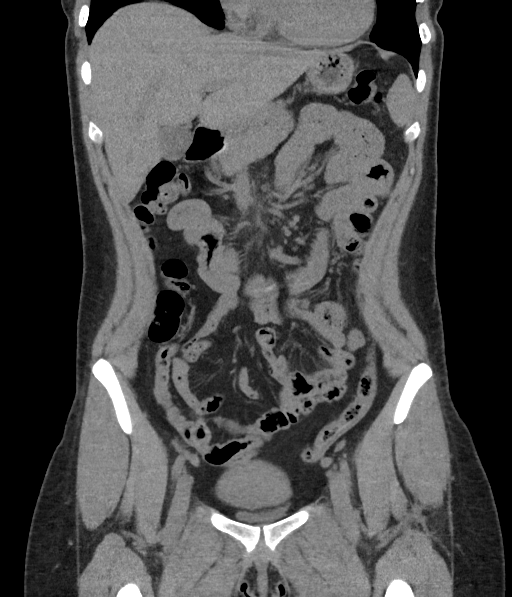
[im 50/112  soft-tissue]
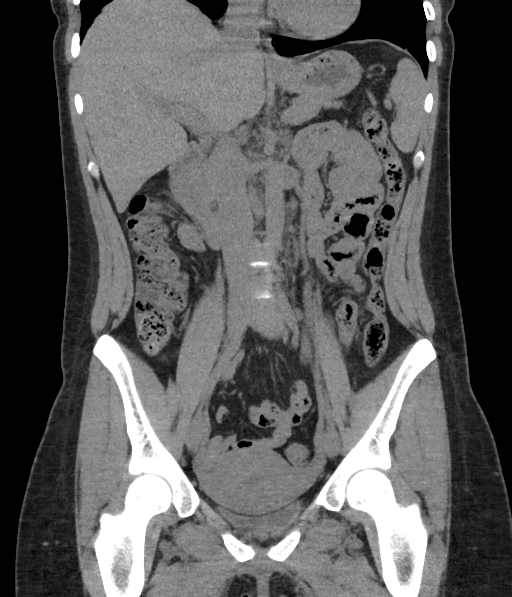
[im 50/112  bone]
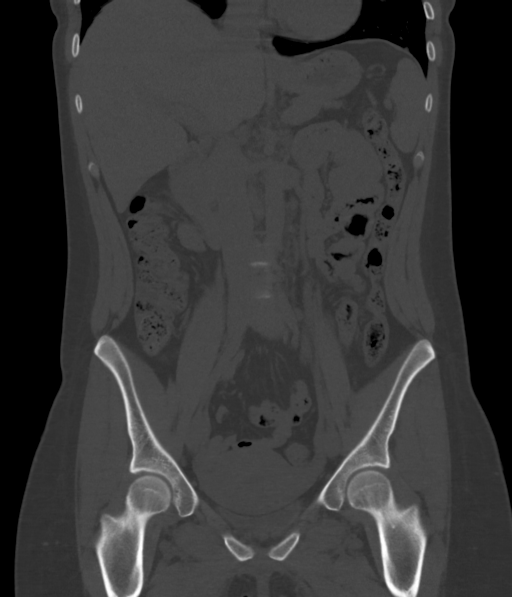
[im 62/112  soft-tissue]
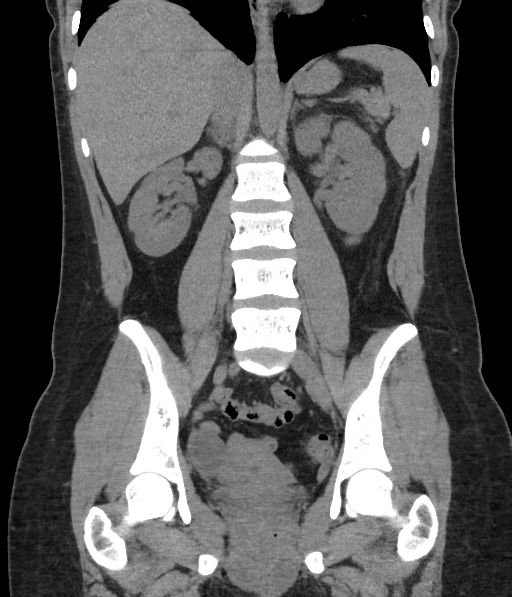

[Series 7: sagittal · sagittal · 0.46mm/px · 1 of 167 slices shown, 2 images]
[im 56/167  soft-tissue]
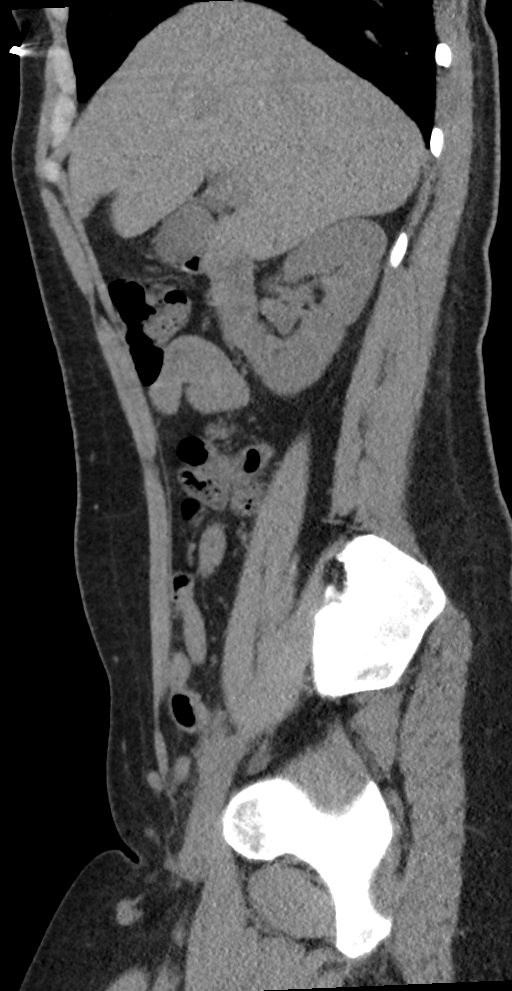
[im 56/167  bone]
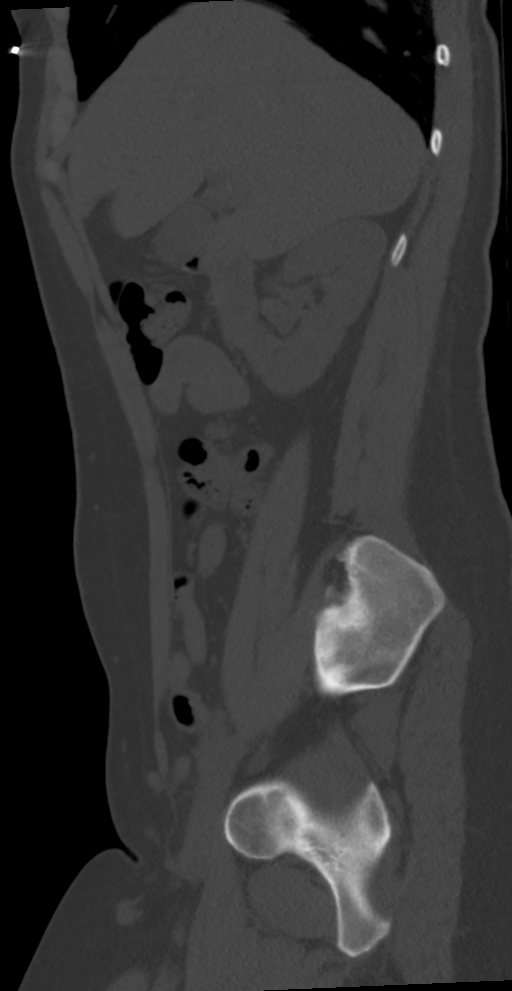

[8 of 46 positions shown; findings below may reference images not displayed]

FINDINGS: Please note that parenchymal abnormalities may be missed without
intravenous contrast.

Lower chest: No acute abnormality. A 4 mm nodule with the lateral
right lung base is unchanged.

Hepatobiliary: The liver and gallbladder are unremarkable. There is
no evidence of biliary dilatation.

Pancreas: Unremarkable

Spleen: Unremarkable

Adrenals/Urinary Tract: Minimal left perinephric stranding is
nonspecific but may be related to infection. The kidneys, adrenal
glands and bladder are otherwise unremarkable. There is no evidence
of hydronephrosis or obstructing urinary calculi.

Stomach/Bowel: Stomach is within normal limits. Appendix appears
normal. No evidence of bowel wall thickening, distention, or
inflammatory changes.

Vascular/Lymphatic: No significant vascular findings are present. No
enlarged abdominal or pelvic lymph nodes.

Reproductive: The uterus and left adnexal regions are unremarkable.
A 2.5 cm right ovarian cyst is identified.

Other: No free fluid, focal collection or pneumoperitoneum.

Musculoskeletal: No acute or significant osseous findings.
IMPRESSION: Minimal left perinephric stranding which may be related to infection
or nonspecific inflammation. No evidence of hydronephrosis.

2.5 cm right ovarian cyst.

## 2017-07-30 ENCOUNTER — Ambulatory Visit (INDEPENDENT_AMBULATORY_CARE_PROVIDER_SITE_OTHER): Payer: Self-pay | Admitting: Family

## 2017-07-30 ENCOUNTER — Encounter: Payer: Self-pay | Admitting: Family

## 2017-07-30 VITALS — BP 118/66 | HR 83 | Temp 98.7°F | Resp 15 | Wt 160.5 lb

## 2017-07-30 DIAGNOSIS — J029 Acute pharyngitis, unspecified: Secondary | ICD-10-CM

## 2017-07-30 LAB — POCT RAPID STREP A (OFFICE): Rapid Strep A Screen: NEGATIVE

## 2017-07-30 MED ORDER — LIDOCAINE VISCOUS 2 % MT SOLN: 15.0000 mL | OROMUCOSAL | 0 refills | Status: DC | PRN

## 2017-07-30 MED ORDER — AMOXICILLIN 500 MG PO CAPS: 500.0000 mg | ORAL_CAPSULE | Freq: Two times a day (BID) | ORAL | 0 refills | Status: DC

## 2017-07-30 NOTE — Progress Notes (Signed)
Subjective:    Patient ID: Shelia [name], female    DOB: Feb 23, 1981, 37 y.o.   MRN: [id number]  CC: Shelia [name] is a 37 y.o. female who presents today for an acute visit.    HPI: 3 days sore throat. Endorses trouble swallowing, ear pain, sinus pressure x 4 days, worsening Claritin, benadryl with some relief. No cough, fever, wheezing.   Smoker  Works in [employer].     HISTORY:  Past Medical History:  Diagnosis Date  . Chicken pox   . Heart murmur    as a child.  Marland Kitchen. Migraines    Past Surgical History:  Procedure Laterality Date  . TUBAL LIGATION     Family History  Problem Relation Age of Onset  . Alcohol abuse Father   . Hypertension Father   . Diabetes Maternal Aunt   . Diabetes Maternal Grandmother   . Hypertension Mother   . Ovarian cancer Paternal Grandmother     Allergies: Other and Metronidazole Current Outpatient Medications on File Prior to Visit  Medication Sig Dispense Refill  . cephALEXin (KEFLEX) 500 MG capsule Take 1 capsule (500 mg total) by mouth 2 (two) times daily. 14 capsule 0  . dicyclomine (BENTYL) 20 MG tablet Take 1 tablet (20 mg total) by mouth 4 (four) times daily as needed for spasms. (Patient not taking: Reported on 08/08/2016) 90 tablet 3   No current facility-administered medications on file prior to visit.     Social History   Tobacco Use  . Smoking status: Current Every Day Smoker  . Smokeless tobacco: Never Used  Substance Use Topics  . Alcohol use: No    Alcohol/week: 0.0 oz  . Drug use: No    Review of Systems  Constitutional: Negative for chills and fever.  HENT: Positive for congestion, sinus pressure, sinus pain, sore throat and trouble swallowing.   Respiratory: Negative for cough, shortness of breath and wheezing.   Cardiovascular: Negative for chest pain and palpitations.  Gastrointestinal: Negative for nausea and vomiting.      Objective:    There were no vitals taken for this  visit.   Physical Exam  Constitutional: She appears well-developed and well-nourished.  HENT:  Head: Normocephalic and atraumatic.  Right Ear: Hearing, tympanic membrane, external ear and ear canal normal. No drainage, swelling or tenderness. No foreign bodies. Tympanic membrane is not erythematous and not bulging. No middle ear effusion. No decreased hearing is noted.  Left Ear: Hearing, tympanic membrane, external ear and ear canal normal. No drainage, swelling or tenderness. No foreign bodies. Tympanic membrane is not erythematous and not bulging.  No middle ear effusion. No decreased hearing is noted.  Nose: Nose normal. No rhinorrhea. Right sinus exhibits no maxillary sinus tenderness and no frontal sinus tenderness. Left sinus exhibits no maxillary sinus tenderness and no frontal sinus tenderness.  Mouth/Throat: Uvula is midline and mucous membranes are normal. Posterior oropharyngeal erythema present. No oropharyngeal exudate, posterior oropharyngeal edema or tonsillar abscesses.  No exudate  Eyes: Conjunctivae are normal.  Cardiovascular: Regular rhythm, normal heart sounds and normal pulses.  Pulmonary/Chest: Effort normal and breath sounds normal. She has no wheezes. She has no rhonchi. She has no rales.  Lymphadenopathy:       Head (right side): No submental, no submandibular, no tonsillar, no preauricular, no posterior auricular and no occipital adenopathy present.       Head (left side): No submental, no submandibular, no tonsillar, no preauricular, no posterior auricular and no  occipital adenopathy present.    She has no cervical adenopathy.  Neurological: She is alert.  Skin: Skin is warm and dry.  Psychiatric: She has a normal mood and affect. Her speech is normal and behavior is normal. Thought content normal.  Vitals reviewed.      Assessment & Plan:  Pharyngitis, unspecified etiology - Plan: POCT rapid strep A, lidocaine (XYLOCAINE) 2 % solution, amoxicillin (AMOXIL) 500  MG capsule  Strep negative. Non toxic in appearance. Afebrile. Agreed to treat conservatively and if no improvement over the next 12-24 hours, patient understands to start antibiotic.     I am having Shelia Haskell. [name] maintain her dicyclomine and cephALEXin.   No orders of the defined types were placed in this encounter.   Return precautions given.   Risks, benefits, and alternatives of the medications and treatment plan prescribed today were discussed, and patient expressed understanding.   Education regarding symptom management and diagnosis given to patient on AVS.  Continue to follow with Tommie Sams, DO for routine health maintenance.   Shelia [name] and I agreed with plan.   Rennie Plowman, FNP

## 2017-07-30 NOTE — Patient Instructions (Signed)
Salt water gargles  Lidocaine swish and swallow  I suspect that your infection is viral in nature.  As discussed, I advise that you wait to fill the antibiotic after 1-2 days of symptom management to see if your symptoms improve. If you do not show improvement, you may take the antibiotic as prescribed.    Sore Throat A sore throat is pain, burning, irritation, or scratchiness in the throat. When you have a sore throat, you may feel pain or tenderness in your throat when you swallow or talk. Many things can cause a sore throat, including:  An infection.  Seasonal allergies.  Dryness in the air.  Irritants, such as smoke or pollution.  Gastroesophageal reflux disease (GERD).  A tumor.  A sore throat is often the first sign of another sickness. It may happen with other symptoms, such as coughing, sneezing, fever, and swollen neck glands. Most sore throats go away without medical treatment. Follow these instructions at home:  Take over-the-counter medicines only as told by your health care provider.  Drink enough fluids to keep your urine clear or pale yellow.  Rest as needed.  To help with pain, try: ? Sipping warm liquids, such as broth, herbal tea, or warm water. ? Eating or drinking cold or frozen liquids, such as frozen ice pops. ? Gargling with a salt-water mixture 3-4 times a day or as needed. To make a salt-water mixture, completely dissolve -1 tsp of salt in 1 cup of warm water. ? Sucking on hard candy or throat lozenges. ? Putting a cool-mist humidifier in your bedroom at night to moisten the air. ? Sitting in the bathroom with the door closed for 5-10 minutes while you run hot water in the shower.  Do not use any tobacco products, such as cigarettes, chewing tobacco, and e-cigarettes. If you need help quitting, ask your health care provider. Contact a health care provider if:  You have a fever for more than 2-3 days.  You have symptoms that last (are  persistent) for more than 2-3 days.  Your throat does not get better within 7 days.  You have a fever and your symptoms suddenly get worse. Get help right away if:  You have difficulty breathing.  You cannot swallow fluids, soft foods, or your saliva.  You have increased swelling in your throat or neck.  You have persistent nausea and vomiting. This information is not intended to replace advice given to you by your health care provider. Make sure you discuss any questions you have with your health care provider. Document Released: 07/09/2004 Document Revised: 01/26/2016 Document Reviewed: 03/22/2015 Elsevier Interactive Patient Education  [employer]2018 [employer] Inc.

## 2017-08-04 ENCOUNTER — Ambulatory Visit: Payer: Self-pay | Admitting: *Deleted

## 2017-08-04 NOTE — Telephone Encounter (Signed)
Please advise 

## 2017-08-04 NOTE — Telephone Encounter (Signed)
Call pt Need more detail  Is she febrile? Is she using mucinex and drinking water?  When I saW HER , she had sore throat for 3 days, I suspected viral etiology. Green mucous can be viral or bacterial.   After more detail, if she is clinicially worsening, we can start augmentin ( pended).   Ensure to take probiotics while on antibiotics and also for 2 weeks after completion. It is important to re-colonize the gut with good bacteria and also to prevent any diarrheal infections associated with antibiotic use.

## 2017-08-04 NOTE — Telephone Encounter (Signed)
  Reason for Disposition . Caller has NON-URGENT medication question about med that PCP prescribed and triager unable to answer question  Answer Assessment - Initial Assessment Questions 1. SYMPTOMS: "Do you have any symptoms?"     Patient was seen in the office recently- she was given antibiotic. She is getting worse and thinks she has sinus infection now. Is there anything M. Arnett, NP wants to change?  2. 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 She has increased sinus pressure and is blowing out green nasal discharge. She reports no improvement and wants to know if M. Arnett wants to change her treatment. She can be reached at the office today [phone].  Protocols used: MEDICATION QUESTION CALL-A-AH

## 2017-08-05 NOTE — Telephone Encounter (Signed)
Spoke to patient she will call back and due to she is at work .

## 2017-08-06 NOTE — Telephone Encounter (Signed)
Tried calling patient phone hung up

## 2017-08-10 MED ORDER — AMOXICILLIN-POT CLAVULANATE 875-125 MG PO TABS: 1.0000 | ORAL_TABLET | Freq: Two times a day (BID) | ORAL | 0 refills | Status: DC

## 2017-08-10 NOTE — Telephone Encounter (Signed)
Tired calling patient unable to leave message.

## 2017-08-10 NOTE — Telephone Encounter (Signed)
Called patient back again, Sinus pressure under left sided, throbbing , no fever

## 2017-08-10 NOTE — Telephone Encounter (Signed)
Patient didn't return call within 3 days

## 2017-09-23 ENCOUNTER — Ambulatory Visit: Payer: Self-pay | Admitting: Obstetrics and Gynecology

## 2018-05-27 ENCOUNTER — Ambulatory Visit (INDEPENDENT_AMBULATORY_CARE_PROVIDER_SITE_OTHER): Payer: Self-pay | Admitting: Family Medicine

## 2018-05-27 ENCOUNTER — Encounter: Payer: Self-pay | Admitting: Family Medicine

## 2018-05-27 VITALS — BP 128/58 | HR 103 | Temp 98.6°F | Ht 65.0 in | Wt 167.0 lb

## 2018-05-27 DIAGNOSIS — R05 Cough: Secondary | ICD-10-CM

## 2018-05-27 DIAGNOSIS — J069 Acute upper respiratory infection, unspecified: Secondary | ICD-10-CM

## 2018-05-27 DIAGNOSIS — R059 Cough, unspecified: Secondary | ICD-10-CM

## 2018-05-27 DIAGNOSIS — R0982 Postnasal drip: Secondary | ICD-10-CM

## 2018-05-27 DIAGNOSIS — R070 Pain in throat: Secondary | ICD-10-CM

## 2018-05-27 LAB — POCT RAPID STREP A (OFFICE): RAPID STREP A SCREEN: NEGATIVE

## 2018-05-27 MED ORDER — BENZONATATE 100 MG PO CAPS: 100.0000 mg | ORAL_CAPSULE | Freq: Two times a day (BID) | ORAL | 0 refills | Status: DC | PRN

## 2018-05-27 MED ORDER — METHYLPREDNISOLONE 4 MG PO TBPK: ORAL_TABLET | ORAL | 0 refills | Status: DC

## 2018-05-27 MED ORDER — ALBUTEROL SULFATE HFA 108 (90 BASE) MCG/ACT IN AERS: 2.0000 | INHALATION_SPRAY | Freq: Four times a day (QID) | RESPIRATORY_TRACT | 0 refills | Status: DC | PRN

## 2018-05-27 NOTE — Progress Notes (Signed)
Subjective:    Patient ID: Shelia [name], female    DOB: 09-25-1980, 37 y.o.   MRN: [id number]  HPI  Presents to clinic c/o sore throat. States she finished a zpak on 05/25/18, she was on this due to sinus infection.  Patient states the cough and sore throat have been present since the end of last week, does not cough up any phlegm, sore throat is an itchy scratchy type feeling.  Denies fever or chills.  Denies nausea, vomiting or diarrhea.  Denies generalized body aches.  Patient is a smoker.  Cough seems worse at night, with some wheezing.   Patient Active Problem List   Diagnosis Date Noted  . Strep pharyngitis 08/30/2015  . Preventative health care 07/12/2015  . Abdominal pain 07/12/2015  . Tobacco abuse 07/12/2015  . Fatigue 07/12/2015   Social History   Tobacco Use  . Smoking status: Current Every Day Smoker  . Smokeless tobacco: Never Used  Substance Use Topics  . Alcohol use: No    Alcohol/week: 0.0 standard drinks   Review of Systems   Constitutional: Negative for chills, fatigue and fever.  HENT: +sore throat, nasal congestion Eyes: Negative.   Respiratory: +cough and wheezing Cardiovascular: Negative for chest pain, palpitations and leg swelling.  Gastrointestinal: Negative for abdominal pain, diarrhea, nausea and vomiting.  Genitourinary: Negative for dysuria, frequency and urgency.  Musculoskeletal: Negative for arthralgias and myalgias.  Skin: Negative for color change, pallor and rash.  Neurological: Negative for syncope, light-headedness and headaches.  Psychiatric/Behavioral: The patient is not nervous/anxious.       Objective:   Physical Exam Vitals signs and nursing note reviewed.  Constitutional:      General: She is not in acute distress.    Appearance: She is well-developed. She is not ill-appearing, toxic-appearing or diaphoretic.  HENT:     Head: Normocephalic and atraumatic.     Right Ear: Tympanic membrane and ear canal normal.       Left Ear: Tympanic membrane and ear canal normal.     Nose: Rhinorrhea present.     Comments: +post nasal drip    Mouth/Throat:     Mouth: No oral lesions.     Pharynx: No oropharyngeal exudate or uvula swelling.     Tonsils: No tonsillar exudate or tonsillar abscesses. Swelling: 0 on the right. 0 on the left.  Eyes:     Conjunctiva/sclera: Conjunctivae normal.     Pupils: Pupils are equal, round, and reactive to light.  Neck:     Musculoskeletal: Normal range of motion and neck supple.  Cardiovascular:     Rate and Rhythm: Normal rate and regular rhythm.  Pulmonary:     Effort: Pulmonary effort is normal.     Breath sounds: Wheezing (faint expiratory wheeze bilat upper lobes. ) present.  Lymphadenopathy:     Cervical: Cervical adenopathy present.  Neurological:     Mental Status: She is alert.       Vitals:   05/27/18 0843  BP: (!) 128/58  Pulse: (!) 103  Temp: 98.6 F (37 C)  SpO2: 97%    Assessment & Plan:   Cough in adult, pain in throat, viral URI - rapid strep testing negative in clinic.  Patient reassured that the Z-Pak she just took would cover any sort of strep A infection, sinus, ear infection.  I suspect her cough, wheezing and throat pain symptoms are related to a viral illness.  Patient will take steroid taper, she will use Tessalon  Perles as needed for cough and also will use albuterol inhaler 2 times a day for the next 3 to 5 days and then reduce to as needed dosing.  Patient also advised to try taking Claritin 10 mg a day at this time of year to help dry up postnasal drip.  Patient advised to make annual physical exam appointment when her schedule allows.

## 2018-05-27 NOTE — Patient Instructions (Signed)
Take 2 puffs of inhaler at least 2 times per day for next 3-5 days, then use inhaler only if needed  Also try taking a daily Claritin 10mg  during this time of year to help nasal congestion/post nasal drip

## 2018-05-29 LAB — CULTURE, UPPER RESPIRATORY
MICRO NUMBER: 91495429
SPECIMEN QUALITY:: ADEQUATE

## 2018-06-03 ENCOUNTER — Encounter: Payer: Self-pay | Admitting: Lab

## 2019-02-03 ENCOUNTER — Other Ambulatory Visit (HOSPITAL_COMMUNITY): Admission: RE | Admit: 2019-02-03 | Payer: Self-pay | Source: Ambulatory Visit

## 2019-02-03 ENCOUNTER — Other Ambulatory Visit: Payer: Self-pay

## 2019-02-03 ENCOUNTER — Ambulatory Visit (INDEPENDENT_AMBULATORY_CARE_PROVIDER_SITE_OTHER): Payer: BLUE CROSS/BLUE SHIELD | Admitting: Advanced Practice Midwife

## 2019-02-03 ENCOUNTER — Encounter: Payer: Self-pay | Admitting: Advanced Practice Midwife

## 2019-02-03 VITALS — BP 126/70 | HR 88 | Ht 65.0 in | Wt 164.0 lb

## 2019-02-03 DIAGNOSIS — Z01419 Encounter for gynecological examination (general) (routine) without abnormal findings: Secondary | ICD-10-CM

## 2019-02-03 DIAGNOSIS — R102 Pelvic and perineal pain: Secondary | ICD-10-CM

## 2019-02-03 DIAGNOSIS — Z124 Encounter for screening for malignant neoplasm of cervix: Secondary | ICD-10-CM

## 2019-02-03 NOTE — Patient Instructions (Signed)
Health Maintenance, Female Adopting a healthy lifestyle and getting preventive care are important in promoting health and wellness. Ask your health care provider about:  The right schedule for you to have regular tests and exams.  Things you can do on your own to prevent diseases and keep yourself healthy. What should I know about diet, weight, and exercise? Eat a healthy diet   Eat a diet that includes plenty of vegetables, fruits, low-fat dairy products, and lean protein.  Do not eat a lot of foods that are high in solid fats, added sugars, or sodium. Maintain a healthy weight Body mass index (BMI) is used to identify weight problems. It estimates body fat based on height and weight. Your health care provider can help determine your BMI and help you achieve or maintain a healthy weight. Get regular exercise Get regular exercise. This is one of the most important things you can do for your health. Most adults should:  Exercise for at least 150 minutes each week. The exercise should increase your heart rate and make you sweat (moderate-intensity exercise).  Do strengthening exercises at least twice a week. This is in addition to the moderate-intensity exercise.  Spend less time sitting. Even light physical activity can be beneficial. Watch cholesterol and blood lipids Have your blood tested for lipids and cholesterol at 36 years of age, then have this test every 5 years. Have your cholesterol levels checked more often if:  Your lipid or cholesterol levels are high.  You are older than 36 years of age.  You are at high risk for heart disease. What should I know about cancer screening? Depending on your health history and family history, you may need to have cancer screening at various ages. This may include screening for:  Breast cancer.  Cervical cancer.  Colorectal cancer.  Skin cancer.  Lung cancer. What should I know about heart disease, diabetes, and high blood  pressure? Blood pressure and heart disease  High blood pressure causes heart disease and increases the risk of stroke. This is more likely to develop in people who have high blood pressure readings, are of African descent, or are overweight.  Have your blood pressure checked: ? Every 3-5 years if you are 18-39 years of age. ? Every year if you are 40 years old or older. Diabetes Have regular diabetes screenings. This checks your fasting blood sugar level. Have the screening done:  Once every three years after age 40 if you are at a normal weight and have a low risk for diabetes.  More often and at a younger age if you are overweight or have a high risk for diabetes. What should I know about preventing infection? Hepatitis B If you have a higher risk for hepatitis B, you should be screened for this virus. Talk with your health care provider to find out if you are at risk for hepatitis B infection. Hepatitis C Testing is recommended for:  Everyone born from 1945 through 1965.  Anyone with known risk factors for hepatitis C. Sexually transmitted infections (STIs)  Get screened for STIs, including gonorrhea and chlamydia, if: ? You are sexually active and are younger than 36 years of age. ? You are older than 36 years of age and your health care provider tells you that you are at risk for this type of infection. ? Your sexual activity has changed since you were last screened, and you are at increased risk for chlamydia or gonorrhea. Ask your health care provider if   you are at risk.  Ask your health care provider about whether you are at high risk for HIV. Your health care provider may recommend a prescription medicine to help prevent HIV infection. If you choose to take medicine to prevent HIV, you should first get tested for HIV. You should then be tested every 3 months for as long as you are taking the medicine. Pregnancy  If you are about to stop having your period (premenopausal) and  you may become pregnant, seek counseling before you get pregnant.  Take 400 to 800 micrograms (mcg) of folic acid every day if you become pregnant.  Ask for birth control (contraception) if you want to prevent pregnancy. Osteoporosis and menopause Osteoporosis is a disease in which the bones lose minerals and strength with aging. This can result in bone fractures. If you are 65 years old or older, or if you are at risk for osteoporosis and fractures, ask your health care provider if you should:  Be screened for bone loss.  Take a calcium or vitamin D supplement to lower your risk of fractures.  Be given hormone replacement therapy (HRT) to treat symptoms of menopause. Follow these instructions at home: Lifestyle  Do not use any products that contain nicotine or tobacco, such as cigarettes, e-cigarettes, and chewing tobacco. If you need help quitting, ask your health care provider.  Do not use street drugs.  Do not share needles.  Ask your health care provider for help if you need support or information about quitting drugs. Alcohol use  Do not drink alcohol if: ? Your health care provider tells you not to drink. ? You are pregnant, may be pregnant, or are planning to become pregnant.  If you drink alcohol: ? Limit how much you use to 0-1 drink a day. ? Limit intake if you are breastfeeding.  Be aware of how much alcohol is in your drink. In the [place] one drink equals one 12 oz bottle of beer (355 mL), one 5 oz glass of wine (148 mL), or one 1 oz glass of hard liquor (44 mL). General instructions  Schedule regular health, dental, and eye exams.  Stay current with your vaccines.  Tell your health care provider if: ? You often feel depressed. ? You have ever been abused or do not feel safe at home. Summary  Adopting a healthy lifestyle and getting preventive care are important in promoting health and wellness.  Follow your health care provider's instructions about healthy  diet, exercising, and getting tested or screened for diseases.  Follow your health care provider's instructions on monitoring your cholesterol and blood pressure. This information is not intended to replace advice given to you by your health care provider. Make sure you discuss any questions you have with your health care provider. Document Released: 12/15/2010 Document Revised: 05/25/2018 Document Reviewed: 05/25/2018 Elsevier Patient Education  2020 [employer]  

## 2019-02-04 ENCOUNTER — Encounter: Payer: Self-pay | Admitting: Advanced Practice Midwife

## 2019-02-04 NOTE — Progress Notes (Signed)
Shelia [name]   PCP: Shelia Green, FNP  Chief Complaint:  Chief Complaint  Patient presents with  . Gynecologic [name]    recently had 3 different times of bleeding in one month     History of Present Illness: Patient is a 38 y.o. [id number] presents for annual [name]. The patient has complaint today of intermenstrual spotting for 3 days following most recent cycle. Discussed occasional cycle irregularities can be normal. She has a history of right ovarian cyst. She has occasional, brief pain in her right lower abdomen.   Patient has a notable family history of ovarian cancer in her paternal grandmother. She had MyRisk lab drawn 3 years ago but the lab was canceled. She is still interested in having cancer genetic screening done.   LMP: Patient's last menstrual period was 12/21/2018 (exact date). Average Interval: regular, 28 days Duration of flow: 4-7 days Heavy Menses: usually light with occasional heavy Clots: occasional Intermenstrual Bleeding: see HPI above Postcoital Bleeding: no Dysmenorrhea: no  The patient is sexually active. She currently uses tubal ligation for contraception. She denies dyspareunia.  The patient does perform self breast exams.    The patient wears seatbelts: yes.   The patient has regular exercise: she plays with her children. She admits healthy diet and adequate water intake. She also has sweet tea daily. She admits adequate sleep.    The patient denies current symptoms of depression.  She admits anxiety that she is able to cope with.   Review of Systems: Review of Systems  Constitutional: Negative.   HENT: Negative.   Eyes: Negative.   Respiratory: Negative.   Cardiovascular: Negative.   Gastrointestinal: Negative.   Genitourinary:       Pelvic pain  Musculoskeletal: Negative.   Skin: Negative.   Neurological: Negative.   Endo/Heme/Allergies: Negative.   Psychiatric/Behavioral:       Anxiety     Past Medical History:  Past  Medical History:  Diagnosis Date  . Chicken pox   . Heart murmur    as a child.  Shelia [name] Migraines     Past Surgical History:  Past Surgical History:  Procedure Laterality Date  . TUBAL LIGATION  01/03/2005    Gynecologic History:  Patient's last menstrual period was 12/21/2018 (exact date). Contraception: tubal ligation Last Pap: 4 years ago Results were: no abnormalities   Obstetric History: [id number]  Family History:  Family History  Problem Relation Age of Onset  . Alcohol abuse Father   . Hypertension Father   . Diabetes Maternal Aunt   . Diabetes Maternal Grandmother   . Hypertension Mother   . Ovarian cancer Paternal Grandmother     Social History:  Social History   Socioeconomic History  . Marital status: Significant Other    Spouse name: Not on file  . Number of children: Not on file  . Years of education: Not on file  . Highest education level: Not on file  Occupational History  . Not on file  Social Needs  . Financial resource strain: Not on file  . Food insecurity    Worry: Not on file    Inability: Not on file  . Transportation needs    Medical: Not on file    Non-medical: Not on file  Tobacco Use  . Smoking status: Current Every Day Smoker    Types: Cigarettes  . Smokeless tobacco: Never Used  Substance and Sexual Activity  . Alcohol use: No    Alcohol/week: 0.0  standard drinks  . Drug use: No  . Sexual activity: Yes    Partners: Male    Birth control/protection: Surgical    Comment: Tubal ligation   Lifestyle  . Physical activity    Days per week: Not on file    Minutes per session: Not on file  . Stress: Not on file  Relationships  . Social Musicianconnections    Talks on phone: Not on file    Gets together: Not on file    Attends religious service: Not on file    Active member of club or organization: Not on file    Attends meetings of clubs or organizations: Not on file    Relationship status: Not on file  . Intimate partner violence     Fear of current or ex partner: Not on file    Emotionally abused: Not on file    Physically abused: Not on file    Forced sexual activity: Not on file  Other Topics Concern  . Not on file  Social History Narrative  . Not on file    Allergies:  Allergies  Allergen Reactions  . Other     Tingling with seafood  . Metronidazole Rash    Medications: Prior to Admission medications   Not on File    Physical [name] Vitals: Blood pressure 126/70, pulse 88, height 5\' 5"  (1.651 m), weight 164 lb (74.4 kg), last menstrual period 12/21/2018.  General: NAD HEENT: normocephalic, anicteric Thyroid: no enlargement, no palpable nodules Pulmonary: No increased work of breathing, CTAB Cardiovascular: RRR, distal pulses 2+ Breast: Breast symmetrical, no tenderness, no palpable nodules or masses, no skin or nipple retraction present, no nipple discharge.  No axillary or supraclavicular lymphadenopathy. Abdomen: NABS, soft, non-tender, non-distended.  Umbilicus without lesions.  No hepatomegaly, splenomegaly or masses palpable. No evidence of hernia  Genitourinary:  External: Normal external female genitalia.  Normal urethral meatus, normal Bartholin's and Skene's glands.    Vagina: Normal vaginal mucosa, no evidence of prolapse.    Cervix: Grossly normal in appearance, no bleeding, no CMT  Uterus: Non-enlarged, mobile, normal contour.    Adnexa: left ovary non-enlarged, non-tender, right ovary palpates enlarged, mildly tender  Rectal: deferred  Lymphatic: no evidence of inguinal lymphadenopathy Extremities: no edema, erythema, or tenderness Neurologic: Grossly intact Psychiatric: mood appropriate, affect full   Assessment: 38 y.o. G3P2002 routine annual [name]  Plan: Problem List Items Addressed This Visit    None    Visit Diagnoses    Well woman [name] with routine gynecological [name]    -  Primary   Relevant Orders   Cytology - PAP   Pelvic pain in female       Relevant Orders   US  PELVIS TRANSVAGINAL NON-OB (TV ONLY)   Cervical cancer screening       Relevant Orders   Cytology - PAP      1) STI screening  was offered and declined  2)  ASCCP guidelines and rationale discussed.  Patient opts for every 3 years screening interval  3) Contraception - the patient is currently using  tubal ligation.  She is happy with her current form of contraception and plans to continue  4) Routine healthcare maintenance including cholesterol, diabetes screening discussed Declines  5) Return in about 1 week (around 02/10/2019) for gyn u/s and follow up,  MyRisk screening. Return in 1 year for annual [name]   Tresea MallJane Kahlea Cobert, CNM [facility] 02/04/2019, 2:32 PM

## 2019-02-08 LAB — CYTOLOGY - PAP
Diagnosis: NEGATIVE
HPV: NOT DETECTED

## 2019-02-10 ENCOUNTER — Telehealth: Payer: Self-pay | Admitting: Advanced Practice Midwife

## 2019-02-10 NOTE — Telephone Encounter (Signed)
Patient is calling for labs results. Please advise. 

## 2019-02-17 ENCOUNTER — Ambulatory Visit (INDEPENDENT_AMBULATORY_CARE_PROVIDER_SITE_OTHER): Payer: BLUE CROSS/BLUE SHIELD | Admitting: Advanced Practice Midwife

## 2019-02-17 ENCOUNTER — Encounter: Payer: Self-pay | Admitting: Advanced Practice Midwife

## 2019-02-17 ENCOUNTER — Other Ambulatory Visit: Payer: Self-pay

## 2019-02-17 ENCOUNTER — Ambulatory Visit (INDEPENDENT_AMBULATORY_CARE_PROVIDER_SITE_OTHER): Payer: BLUE CROSS/BLUE SHIELD

## 2019-02-17 VITALS — BP 100/60 | Ht 65.0 in | Wt 163.0 lb

## 2019-02-17 DIAGNOSIS — N939 Abnormal uterine and vaginal bleeding, unspecified: Secondary | ICD-10-CM

## 2019-02-17 DIAGNOSIS — Z09 Encounter for follow-up examination after completed treatment for conditions other than malignant neoplasm: Secondary | ICD-10-CM

## 2019-02-17 DIAGNOSIS — R102 Pelvic and perineal pain: Secondary | ICD-10-CM | POA: Diagnosis not present

## 2019-02-17 NOTE — Patient Instructions (Signed)
Abnormal Uterine Bleeding Abnormal uterine bleeding is unusual bleeding from the uterus. It includes:  Bleeding or spotting between periods.  Bleeding after sex.  Bleeding that is heavier than normal.  Periods that last longer than usual.  Bleeding after menopause. Abnormal uterine bleeding can affect women at various stages in life, including teenagers, women in their reproductive years, pregnant women, and women who have reached menopause. Common causes of abnormal uterine bleeding include:  Pregnancy.  Growths of tissue (polyps).  A noncancerous tumor in the uterus (fibroid).  Infection.  Cancer.  Hormonal imbalances. Any type of abnormal bleeding should be evaluated by a health care provider. Many cases are minor and simple to treat, while others are more serious. Treatment will depend on the cause of the bleeding. Follow these instructions at home:  Monitor your condition for any changes.  Do not use tampons, douche, or have sex if told by your health care provider.  Change your pads often.  Get regular exams that include pelvic exams and cervical cancer screening.  Keep all follow-up visits as told by your health care provider. This is important. Contact a health care provider if:  Your bleeding lasts for more than one week.  You feel dizzy at times.  You feel nauseous or you vomit. Get help right away if:  You pass out.  Your bleeding soaks through a pad every hour.  You have abdominal pain.  You have a fever.  You become sweaty or weak.  You pass large blood clots from your vagina. Summary  Abnormal uterine bleeding is unusual bleeding from the uterus.  Any type of abnormal bleeding should be evaluated by a health care provider. Many cases are minor and simple to treat, while others are more serious.  Treatment will depend on the cause of the bleeding. This information is not intended to replace advice given to you by your health care provider.  Make sure you discuss any questions you have with your health care provider. Document Released: 06/01/2005 Document Revised: 09/08/2017 Document Reviewed: 07/03/2016 Elsevier Patient Education  2020 Elsevier Inc.  

## 2019-02-21 NOTE — Progress Notes (Signed)
Patient ID: Shelia [name], female   DOB: November 30, 1980, 38 y.o.   MRN: [id number]  Reason for Consult: Follow-up   Date of service: 02/17/2019 Subjective:     HPI:  Shelia [name] is a 38 y.o. female being seen for follow up ultrasound and MyRisk testing.   Today she mentions that she has had a total of 4 bleeding events in the past 35 days. Two of the events were spotting for a couple days and the other 2 were like a period flow.  The patient was seen 2 weeks ago for annual visit. She had complaints of cycle irregularities and some intermittent right lower abdomen pain. Given her personal history of an ovarian cyst and her family history of ovarian cancer she agreed to have a gyn ultrasound follow up visit.   She previously had MyRisk drawn but due to complications at the time of the draw 3 years ago, she will have it done again today.  We discussed the normal results of the ultrasound today. She agrees to wait another couple months to see if there is a pattern of irregular bleeding. She may want to discuss ablation. She declines hormonal regulation of cycles at this time.  Past Medical History:  Diagnosis Date  . Chicken pox   . Heart murmur    as a child.  . Migraines    Family History  Problem Relation Age of Onset  . Alcohol abuse Father   . Hypertension Father   . Diabetes Maternal Aunt   . Diabetes Maternal Grandmother   . Hypertension Mother   . Ovarian cancer Paternal Grandmother 11  . Stomach cancer Paternal Grandmother   . Ovarian cancer Paternal Aunt        BRCA POS  . Uterine cancer Paternal Great-grandmother    Past Surgical History:  Procedure Laterality Date  . TUBAL LIGATION  01/03/2005    Short Social History:  Social History   Tobacco Use  . Smoking status: Current Every Day Smoker    Types: Cigarettes  . Smokeless tobacco: Never Used  Substance Use Topics  . Alcohol use: No    Alcohol/week: 0.0 standard drinks    Allergies   Allergen Reactions  . Other     Tingling with seafood  . Metronidazole Rash    No current outpatient medications on file.   No current facility-administered medications for this visit.     Review of Systems  Constitutional: Negative.   HENT: Negative.   Eyes: Negative.   Respiratory: Negative.   Cardiovascular: Negative.   Gastrointestinal: Negative.   Genitourinary:       Irregular vaginal bleeding  Musculoskeletal: Negative.   Skin: Negative.   Neurological: Negative.   Endo/Heme/Allergies: Negative.   Psychiatric/Behavioral: Negative.         Objective:  Objective   Vitals:   02/17/19 1428  BP: 100/60  Weight: 163 lb (73.9 kg)  Height: 5' 5"  (1.651 m)   Body mass index is 27.12 kg/m.   Data: Patient Name: Shelia [name] DOB: 20-Nov-1980 MRN: [id number]  ULTRASOUND REPORT  Location: [facility]  Date of Service: 02/17/2019   Indications:Abnormal Uterine Bleeding   Findings:  The uterus is anteverted and measures 9.4 x 5.1 x 4.5 cm. Echo texture is homogenous without evidence of focal masses.  The Endometrium measures 9.2 mm.  Right Ovary measures 2.5 x 2.3 x 1.7 cm. It is normal in appearance. Left Ovary measures 3.2 x 2.9 x 1.8 cm. It is  normal in appearance. Survey of the adnexa demonstrates no adnexal masses. There is no free fluid in the cul de sac.  Impression: 1. Normal pelvic ultrasound.  Gweneth Dimitri, RT      Assessment/Plan:     38 year old G3 P3 female with abnormal uterine bleeding, normal gyn ultrasound  Follow up PRN with MD for discussion of ablation   [facility] 02/21/2019, 11:54 AM

## 2019-03-01 ENCOUNTER — Telehealth: Payer: Self-pay

## 2019-03-01 NOTE — Telephone Encounter (Signed)
Called pt to update family history for [facility] pre-authorization. Could not leave voice msg, operator said person unavailable. I need to know if PGGM and PGM are still living or deceased.

## 2019-03-02 NOTE — Telephone Encounter (Signed)
Shelia [name] returned call to thank [place] for the updated information. They need this in writing along with test order clarification and it looks like her insurance is going to require an authorization so they also need clinical notes. Fax#[phone].

## 2019-03-02 NOTE — Telephone Encounter (Addendum)
LMVM to notify Amber ([facility]) of updated info. Updated History in patient chart.

## 2019-03-02 NOTE — Telephone Encounter (Signed)
Shelia [name] w/[facility] f/u on a fax she sent 02/27/2019 on this pt. Inquiring if received or if we need more time. Cb#[phone]

## 2019-03-02 NOTE — Telephone Encounter (Signed)
Correction to my error my patient report both [place] grandmother and Grandmother to be deceased.

## 2019-03-02 NOTE — Telephone Encounter (Signed)
Patient is returning missed call. Patient states both [place] grandmother and Grandmother are Decrease. Advised Willette Pa is out of office will return tomorrow if she has anymore questions.

## 2019-03-03 NOTE — Telephone Encounter (Signed)
Papers faxed.

## 2019-03-16 DIAGNOSIS — Z1371 Encounter for nonprocreative screening for genetic disease carrier status: Secondary | ICD-10-CM

## 2019-03-16 HISTORY — DX: Encounter for nonprocreative screening for genetic disease carrier status: Z13.71

## 2019-03-22 ENCOUNTER — Encounter: Payer: Self-pay | Admitting: Obstetrics and Gynecology

## 2019-03-29 NOTE — Telephone Encounter (Signed)
Patient is calling due to missed call please advise. Work number [phone]

## 2019-03-29 NOTE — Telephone Encounter (Signed)
I have not called pt, maybe a provider.

## 2019-03-30 NOTE — Telephone Encounter (Addendum)
Opal Sidles may have called her with results? Will send to [facility].

## 2019-03-30 NOTE — Telephone Encounter (Signed)
Called pt, no answer, and could not leave voice msg due to mail box not set up.

## 2019-03-31 NOTE — Telephone Encounter (Signed)
Spoke with patient and reviewed normal MyRisk results. She has complaint of continued pelvic, back and upper thigh pain. Suggested she call to schedule visit with MD to discuss diagnosing for endometriosis.

## 2019-03-31 NOTE — Telephone Encounter (Signed)
I did attempt to call with no answer and I had Bev send normal results. I will try calling again.

## 2019-03-31 NOTE — Telephone Encounter (Signed)
Patient is calling from mobile number returning missed call. Please advise

## 2019-03-31 NOTE — Telephone Encounter (Signed)
I tried calling work number- only got recorded message with no way to leave message. I tried calling home number again with message that customer is not available and no way to leave message. Her results have been sent to her for her to see. If she has questions about the results she can call the office.

## 2019-04-14 ENCOUNTER — Ambulatory Visit (INDEPENDENT_AMBULATORY_CARE_PROVIDER_SITE_OTHER): Payer: BLUE CROSS/BLUE SHIELD | Admitting: Obstetrics and Gynecology

## 2019-04-14 ENCOUNTER — Encounter: Payer: Self-pay | Admitting: Obstetrics and Gynecology

## 2019-04-14 ENCOUNTER — Other Ambulatory Visit: Payer: Self-pay

## 2019-04-14 VITALS — BP 126/72 | HR 95 | Ht 65.0 in | Wt 165.0 lb

## 2019-04-14 DIAGNOSIS — Z1371 Encounter for nonprocreative screening for genetic disease carrier status: Secondary | ICD-10-CM

## 2019-04-14 DIAGNOSIS — N939 Abnormal uterine and vaginal bleeding, unspecified: Secondary | ICD-10-CM | POA: Diagnosis not present

## 2019-04-14 NOTE — Progress Notes (Signed)
Gynecology Abnormal Uterine Bleeding Initial Evaluation   Chief Complaint:  Chief Complaint  Patient presents with  . Pelvic Pain    pain during intercourse, sporadic pain     History of Present Illness:    Paitient is a 38 y.o. [id number] who LMP was Patient's last menstrual period was 03/24/2019 (approximate)., presents today for a problem visit.  She complains of menorrhagia that  began gradually about 2 years ago and its severity is described as moderate.   She has also started noting some irregularities develop in cycle length and duration. The patient menstrual complaints are chronic present for the oast 6 months but with recent acute exacerbation  Period are associated with severe menstrual cramping. The patient is sexually active. She currently uses tubal ligationfor contraception.  She does report dyspareunia.  Her mother has a history of endometriosis. Last Pap results esults were obtained 02/03/2019 NIL and HR HPV negative   Previous evaluation: TVUS 02/17/2019 normal Previous Treatment: none.  Review of Systems: Review of Systems  Constitutional: Negative.   Gastrointestinal: Positive for abdominal pain.  Genitourinary: Negative.   Skin: Negative.     Past Medical History:  Past Medical History:  Diagnosis Date  . BRCA negative 03/2019   pt is BRCA/MyRisk neg; IBIS=10.8%/riskscore=8.4%  . Chicken pox   . Family history of ovarian cancer    BRCA pos mutation in pat aunt  . FH: BRCA gene positive   . Heart murmur    as a child.  Marland Kitchen Migraines     Past Surgical History:  Past Surgical History:  Procedure Laterality Date  . TUBAL LIGATION  01/03/2005    Obstetric History: [id number]  Family History:  Family History  Problem Relation Age of Onset  . Alcohol abuse Father   . Hypertension Father   . Diabetes Maternal Aunt   . Diabetes Maternal Grandmother   . Hypertension Mother   . Ovarian cancer Paternal Grandmother 48  . Stomach cancer Paternal Grandmother    . Ovarian cancer Paternal Aunt        BRCA POS  . Uterine cancer Paternal Great-grandmother     Social History:  Social History   Socioeconomic History  . Marital status: Significant Other    Spouse name: Not on file  . Number of children: Not on file  . Years of education: Not on file  . Highest education level: Not on file  Occupational History  . Not on file  Social Needs  . Financial resource strain: Not on file  . Food insecurity    Worry: Not on file    Inability: Not on file  . Transportation needs    Medical: Not on file    Non-medical: Not on file  Tobacco Use  . Smoking status: Current Every Day Smoker    Types: Cigarettes  . Smokeless tobacco: Never Used  Substance and Sexual Activity  . Alcohol use: No    Alcohol/week: 0.0 standard drinks  . Drug use: No  . Sexual activity: Yes    Partners: Male    Birth control/protection: Surgical    Comment: Tubal ligation   Lifestyle  . Physical activity    Days per week: Not on file    Minutes per session: Not on file  . Stress: Not on file  Relationships  . Social Herbalist on phone: Not on file    Gets together: Not on file    Attends religious service: Not on file  Active member of club or organization: Not on file    Attends meetings of clubs or organizations: Not on file    Relationship status: Not on file  . Intimate partner violence    Fear of current or ex partner: Not on file    Emotionally abused: Not on file    Physically abused: Not on file    Forced sexual activity: Not on file  Other Topics Concern  . Not on file  Social History Narrative  . Not on file    Allergies:  Allergies  Allergen Reactions  . Other     Tingling with seafood  . Metronidazole Rash    Medications: Prior to Admission medications   Not on File    Physical Exam Blood pressure 126/72, pulse 95, height 5' 5"  (1.651 m), weight 165 lb (74.8 kg), last menstrual period 03/24/2019. Body mass index is  27.46 kg/m.,m Patient's last menstrual period was 03/24/2019 (approximate).  General: NAD HEENT: normocephalic, anicteric Thyroid: no enlargement, no palpable nodules Pulmonary: No increased work of breathing Cardiovascular: RRR, distal pulses 2+ Abdomen: NABS, soft, non-tender, non-distended.  Umbilicus without lesions.  No hepatomegaly, splenomegaly or masses palpable. No evidence of hernia  Genitourinary:  External: Normal external female genitalia.  Normal urethral meatus, normal Bartholin's and Skene's glands.    Vagina: Normal vaginal mucosa, no evidence of prolapse.    Cervix: Grossly normal in appearance, no bleeding  Uterus: Non-enlarged, mobile, normal contour.  No CMT  Adnexa: ovaries non-enlarged, no adnexal masses  Rectal: deferred  Lymphatic: no evidence of inguinal lymphadenopathy Extremities: no edema, erythema, or tenderness Neurologic: Grossly intact Psychiatric: mood appropriate, affect full  Female chaperone present for pelvic portions of the physical exam  US Pelvis Transvaginal Non-ob (tv Only)  Result Date: 02/17/2019 Patient Name: TABETHA HARAWAY DOB: 08/29/1980 MRN: [id number] ULTRASOUND REPORT Location: [facility] Date of Service: 02/17/2019 Indications:Abnormal Uterine Bleeding Findings: The uterus is anteverted and measures 9.4 x 5.1 x 4.5 cm. Echo texture is homogenous without evidence of focal masses. The Endometrium measures 9.2 mm. Right Ovary measures 2.5 x 2.3 x 1.7 cm. It is normal in appearance. Left Ovary measures 3.2 x 2.9 x 1.8 cm. It is normal in appearance. Survey of the adnexa demonstrates no adnexal masses. There is no free fluid in the cul de sac. Impression: 1. Normal pelvic ultrasound. Gweneth Dimitri, RT The ultrasound images and findings were reviewed by me and I agree with the above report. Prentice Docker, MD, [facility], [facility] 02/17/2019 7:56 PM       Assessment: 38 y.o. [id number] with abnormal uterine  bleeding  Plan: Problem List Items Addressed This Visit      Other   BRCA negative    Other Visit Diagnoses    Abnormal uterine bleeding    -  Primary   Relevant Orders   TSH+Prl+FSH+TestT+LH+DHEA S...   Estradiol   CBC      1) Discussed management options for abnormal uterine bleeding including expectant, NSAIDs, tranexamic acid (Lysteda), oral progesterone (Provera, norethindrone, megace), Depo Provera, Levonorgestrel containing IUD, endometrial ablation (Novasure) or hysterectomy as definitive surgical management.  Discussed risks and benefits of each method.   Final management decision will hinge on results of patient's work up and whether an underlying etiology for the patients bleeding symptoms can be discerned.  We will conduct a basic work up examining using the PALM-COIEN classification system.  The role of unopposed estrogen in the development of endometrial  hyperplasia or carcinoma is discussed.  The risk of endometrial hyperplasia is linearly correlated with increasing BMI given the production of estrone by adipose tissue. - Labs obtained today  - Prior ultrasound reviewed and normal  2) A total of 15 minutes were spent in face-to-face contact with the patient during this encounter with over half of that time devoted to counseling and coordination of care.  3) Return if symptoms worsen or fail to improve.   Malachy Mood, MD, [facility], [place] 04/14/2019, 9:07 AM

## 2019-04-22 LAB — ESTRADIOL: Estradiol: 21.7 pg/mL

## 2019-04-22 LAB — TSH+PRL+FSH+TESTT+LH+DHEA S...
17-Hydroxyprogesterone: 28 ng/dL
Androstenedione: 115 ng/dL (ref 41–262)
DHEA-SO4: 152 ug/dL (ref 57.3–279.2)
FSH: 8.5 m[IU]/mL
LH: 9.6 m[IU]/mL
Prolactin: 16.2 ng/mL (ref 4.8–23.3)
TSH: 2.1 u[IU]/mL (ref 0.450–4.500)
Testosterone, Free: 1.8 pg/mL (ref 0.0–4.2)
Testosterone: 20 ng/dL (ref 8–48)

## 2019-04-22 LAB — CBC
Hematocrit: 42.5 % (ref 34.0–46.6)
Hemoglobin: 14 g/dL (ref 11.1–15.9)
MCH: 30 pg (ref 26.6–33.0)
MCHC: 32.9 g/dL (ref 31.5–35.7)
MCV: 91 fL (ref 79–97)
Platelets: 203 10*3/uL (ref 150–450)
RBC: 4.66 x10E6/uL (ref 3.77–5.28)
RDW: 12.5 % (ref 11.7–15.4)
WBC: 8.1 10*3/uL (ref 3.4–10.8)

## 2019-04-28 ENCOUNTER — Telehealth: Payer: Self-pay

## 2019-04-28 NOTE — Telephone Encounter (Signed)
Pt calling for results. She is aware you are out of the office today. She is fine with waiting until Monday when you return

## 2019-05-01 ENCOUNTER — Encounter: Payer: Self-pay | Admitting: Obstetrics and Gynecology

## 2023-01-28 ENCOUNTER — Encounter: Payer: Self-pay | Admitting: Emergency Medicine

## 2023-01-28 ENCOUNTER — Other Ambulatory Visit: Payer: Self-pay

## 2023-01-28 ENCOUNTER — Emergency Department
Admission: EM | Admit: 2023-01-28 | Discharge: 2023-01-28 | Discharge status: Against Medical Advice | Payer: Self-pay | Attending: Emergency Medicine | Admitting: Emergency Medicine

## 2023-01-28 DIAGNOSIS — Z5321 Procedure and treatment not carried out due to patient leaving prior to being seen by health care provider: Secondary | ICD-10-CM | POA: Insufficient documentation

## 2023-01-28 DIAGNOSIS — H9202 Otalgia, left ear: Secondary | ICD-10-CM | POA: Insufficient documentation

## 2023-01-28 NOTE — ED Triage Notes (Signed)
Pt arrived via POV with c/o L side ear pain that started today.

## 2023-01-29 ENCOUNTER — Other Ambulatory Visit: Payer: Self-pay

## 2023-01-29 ENCOUNTER — Ambulatory Visit: Payer: Self-pay

## 2023-01-29 ENCOUNTER — Emergency Department
Admission: EM | Admit: 2023-01-29 | Discharge: 2023-01-29 | Disposition: A | Discharge status: Home / Self Care | Payer: Self-pay | Attending: Emergency Medicine | Admitting: Emergency Medicine

## 2023-01-29 DIAGNOSIS — H6692 Otitis media, unspecified, left ear: Secondary | ICD-10-CM | POA: Insufficient documentation

## 2023-01-29 DIAGNOSIS — H6690 Otitis media, unspecified, unspecified ear: Secondary | ICD-10-CM

## 2023-01-29 DIAGNOSIS — F17200 Nicotine dependence, unspecified, uncomplicated: Secondary | ICD-10-CM | POA: Insufficient documentation

## 2023-01-29 DIAGNOSIS — J069 Acute upper respiratory infection, unspecified: Secondary | ICD-10-CM | POA: Insufficient documentation

## 2023-01-29 DIAGNOSIS — Z20822 Contact with and (suspected) exposure to covid-19: Secondary | ICD-10-CM | POA: Insufficient documentation

## 2023-01-29 LAB — GROUP A STREP BY PCR: Group A Strep by PCR: NOT DETECTED

## 2023-01-29 LAB — SARS CORONAVIRUS 2 BY RT PCR: SARS Coronavirus 2 by RT PCR: NEGATIVE

## 2023-01-29 MED ORDER — AMOXICILLIN-POT CLAVULANATE 875-125 MG PO TABS: 1.0000 | ORAL_TABLET | Freq: Two times a day (BID) | ORAL | 0 refills | Status: AC

## 2023-01-29 NOTE — ED Provider Notes (Signed)
[facility] Provider Note    Event Date/Time   First MD Initiated Contact with Patient 01/29/23 1401     (approximate)   History   Otalgia   HPI  Shelia [name] is a 42 y.o. female who presents today for evaluation of congestion that began on Monday and left ear pain that began yesterday.  She reports that her sore throat has improved acutely, and she is mostly concerned about her left ear.  She reports that it feels muffled and has sharp stabbing pain.  She has not noticed any drainage from the ear.  No fevers or chills.  No nausea or vomiting.  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  Patient Active Problem List   Diagnosis Date Noted   BRCA negative 04/14/2019   Tobacco abuse 07/12/2015          Physical Exam   Triage Vital Signs: ED Triage Vitals  Encounter Vitals Group     BP 01/29/23 1346 132/74     Systolic BP Percentile --      Diastolic BP Percentile --      Pulse Rate 01/29/23 1346 98     Resp 01/29/23 1346 18     Temp 01/29/23 1346 98.8 F (37.1 C)     Temp Source 01/29/23 1346 Oral     SpO2 01/29/23 1346 96 %     Weight 01/29/23 1346 163 lb (73.9 kg)     Height 01/29/23 1346 5\' 5"  (1.651 m)     Head Circumference --      Peak Flow --      Pain Score 01/29/23 1348 2     Pain Loc --      Pain Education --      Exclude from Growth Chart --     Most recent vital signs: Vitals:   01/29/23 1346  BP: 132/74  Pulse: 98  Resp: 18  Temp: 98.8 F (37.1 C)  SpO2: 96%    Physical Exam Vitals and nursing note reviewed.  Constitutional:      General: Awake and alert. No acute distress.    Appearance: Normal appearance. The patient is normal weight.  HENT:     Head: Normocephalic and atraumatic.     Mouth: Mucous membranes are moist. Uvula midline.  Mild oropharyngeal erythema.  No tonsillar exudate.  No soft palate  fluctuance.  No trismus.  No voice change.  No sublingual swelling.  No tender cervical lymphadenopathy.  No nuchal rigidity Left tympanic membrane is bulging and erythematous.  Normal canal.  No proptosis of pinna.  No mastoid tenderness or erythema. Right TM and canal normal. Eyes:     General: PERRL. Normal EOMs        Right eye: No discharge.        Left eye: No discharge.     Conjunctiva/sclera: Conjunctivae normal.  Cardiovascular:     Rate and Rhythm: Normal rate and regular rhythm.     Pulses: Normal pulses.  Pulmonary:     Effort: Pulmonary effort is normal. No respiratory distress.     Breath sounds: Normal breath sounds.  Abdominal:     Abdomen is soft. There is no abdominal tenderness. No rebound or guarding. No distention. Musculoskeletal:        General: No swelling. Normal range of motion.     Cervical back: Normal range of motion  and neck supple.  Skin:    General: Skin is warm and dry.     Capillary Refill: Capillary refill takes less than 2 seconds.     Findings: No rash.  Neurological:     Mental Status: The patient is awake and alert.      ED Results / Procedures / Treatments   Labs (all labs ordered are listed, but only abnormal results are displayed) Labs Reviewed  GROUP A STREP BY PCR  SARS CORONAVIRUS 2 BY RT PCR     EKG     RADIOLOGY     PROCEDURES:  Critical Care performed:   Procedures   MEDICATIONS ORDERED IN ED: Medications - No data to display   IMPRESSION / MDM / ASSESSMENT AND PLAN / ED COURSE  I reviewed the triage vital signs and the nursing notes.   Differential diagnosis includes, but is not limited to, strep pharyngitis, COVID-19, viral pharyngitis, URI, otitis media, otitis externa.  Patient is awake and alert, hemodynamically stable and afebrile.  She is nontoxic in appearance.  She has mild oropharyngeal erythema without specific tonsillar exudate, no uvular deviation.  No trismus or muffled voice.  No drooling.   I do not suspect peritonsillar or retropharyngeal abscess.  Patient does have a bulging and erythematous left tympanic membrane consistent with otitis media.  No proptosis of pinna, no mastoid tenderness erythema to suggest mastoiditis.  She has a clear and normal appearing canal.  Will initiate oral antibiotics.  She understands that she may find the results of her strep and COVID test on MyChart.  We discussed return precautions and outpatient follow-up.  She was advised that if her COVID test is positive then she will need to stay out of work for 5 days and then when she returns to work she must wear a mask for an additional 5 days.  She was also advised that she should quarantine is much as possible unless she requires medical attention as she is highly contagious to others.  She was advised that if she has strep, then she should take the antibiotic for the full 10 days, but if the strep is negative then she may stop at 7 days.  Patient understands and agrees with plan.  She was discharged in stable condition.  She declined work note.   Patient's presentation is most consistent with acute complicated illness / injury requiring diagnostic workup.      FINAL CLINICAL IMPRESSION(S) / ED DIAGNOSES   Final diagnoses:  Acute otitis media, unspecified otitis media type  Upper respiratory tract infection, unspecified type     Rx / DC Orders   ED Discharge Orders          Ordered    amoxicillin-clavulanate (AUGMENTIN) 875-125 MG tablet  2 times daily        01/29/23 1424             Note:  This document was prepared using Dragon voice recognition software and may include unintentional dictation errors.   Keturah Shavers 01/29/23 1451    Minna Antis, MD 01/29/23 1506

## 2023-01-29 NOTE — ED Triage Notes (Signed)
Pt comes with c/o congestion and left ear pain. Pt states this started yesterday. Pt states the congestion and sore throat started on Monday.

## 2023-01-29 NOTE — Discharge Instructions (Signed)
Please take the antibiotics as prescribed for your ear infection, though you may stop it 7 days if your strep is negative.  You may find the results of your strep and COVID test on MyChart.  We will also notify you if these are positive.  If your strep is positive, please take all 10 days of the antibiotics.  Please return for any new, worsening, or change in symptoms or other concerns.  It was a pleasure caring for you today.

## 2024-06-01 NOTE — Progress Notes (Unsigned)
 Gynecology Annual Exam  PCP: Osker Tinnie HERO, FNP  Chief Complaint: No chief complaint on file.   History of Present Illness: Patient is a 43 y.o. H6E7997 presents for annual exam. The patient has no complaints today.   LMP: No LMP recorded. (Menstrual status: Irregular Periods). Average Interval: {Desc; regular/irreg:14544}, {numbers 22-35:14824} days Duration of flow: {numbers; 0-10:33138} days Heavy Menses: {yes/no:63} Clots: {yes/no:63} Intermenstrual Bleeding: {yes/no:63} Postcoital Bleeding: {yes/no:63} Dysmenorrhea: {yes/no:63}   The patient {sys sexually active:13135} sexually active. She currently uses {method:5051} for contraception. She {has/denies:315300} dyspareunia.  The patient {DOES_DOES WNU:81435} perform self breast exams.  There {is/is no:19420} notable family history of breast or ovarian cancer in her family.  The patient wears seatbelts: {yes/no:63}.   The patient has regular exercise: {yes/no/not asked:9010}.    The patient {Blank single:19197::reports,denies} current symptoms of depression.    Review of Systems: ROS  Past Medical History:  Patient Active Problem List   Diagnosis Date Noted Date Diagnosed   BRCA negative 04/14/2019     Myriad MyRisk negative 02/17/2019    Tobacco abuse 07/12/2015     Past Surgical History:  Past Surgical History:  Procedure Laterality Date   TUBAL LIGATION  01/03/2005    Gynecologic History:  No LMP recorded. (Menstrual status: Irregular Periods).  Last Pap: Results were:  {Findings; lab pap smear results:16707::NIL and HR HPV+,NIL and HR HPV negative}  Last mammogram: *** Results were: {Blank single:19197::***,BI-RADS IV,BI-RAD III,BI-RAD II,BI-RAD I}  Obstetric History: H6E7997  Family History:  Family History  Problem Relation Age of Onset   Alcohol abuse Father    Hypertension Father    Diabetes Maternal Aunt    Diabetes Maternal Grandmother    Hypertension Mother    Ovarian cancer  Paternal Grandmother 44   Stomach cancer Paternal Grandmother    Ovarian cancer Paternal Aunt        BRCA POS   Uterine cancer Paternal Great-grandmother     Social History:  Social History   Socioeconomic History   Marital status: Significant Other    Spouse name: Not on file   Number of children: Not on file   Years of education: Not on file   Highest education level: Not on file  Occupational History   Not on file  Tobacco Use   Smoking status: Every Day    Types: Cigarettes   Smokeless tobacco: Never  Vaping Use   Vaping status: Never Used  Substance and Sexual Activity   Alcohol use: No    Alcohol/week: 0.0 standard drinks of alcohol   Drug use: No   Sexual activity: Yes    Partners: Male    Birth control/protection: Surgical    Comment: Tubal ligation   Other Topics Concern   Not on file  Social History Narrative   Not on file   Social Drivers of Health   Tobacco Use: High Risk (01/29/2023)   Patient History    Smoking Tobacco Use: Every Day    Smokeless Tobacco Use: Never    Passive Exposure: Not on file  Financial Resource Strain: Not on file  Food Insecurity: Not on file  Transportation Needs: Not on file  Physical Activity: Not on file  Stress: Not on file  Social Connections: Not on file  Intimate Partner Violence: Not on file  Depression (EYV7-0): Not on file  Alcohol Screen: Not on file  Housing: Not on file  Utilities: Not on file  Health Literacy: Not on file    Allergies:  Allergies[1]  Medications: Prior to Admission medications  Not on File    Physical Exam Vitals: There were no vitals taken for this visit.  General: NAD HEENT: normocephalic, anicteric Thyroid : no enlargement, no palpable nodules Pulmonary: No increased work of breathing, CTAB Cardiovascular: RRR, distal pulses 2+ Breast: Breast symmetrical, no tenderness, no palpable nodules or masses, no skin or nipple retraction present, no nipple discharge.  No axillary or  supraclavicular lymphadenopathy. Abdomen: NABS, soft, non-tender, non-distended.  Umbilicus without lesions.  No hepatomegaly, splenomegaly or masses palpable. No evidence of hernia  Genitourinary:  External: Normal external female genitalia.  Normal urethral meatus, normal Bartholin's and Skene's glands.    Vagina: Normal vaginal mucosa, no evidence of prolapse.    Cervix: Grossly normal in appearance, no bleeding  Uterus: Non-enlarged, mobile, normal contour.  No CMT  Adnexa: ovaries non-enlarged, no adnexal masses  Rectal: deferred  Lymphatic: no evidence of inguinal lymphadenopathy Extremities: no edema, erythema, or tenderness Neurologic: Grossly intact Psychiatric: mood appropriate, affect full   Assessment: 43 y.o. G3P2002 routine annual exam  Plan: Problem List Items Addressed This Visit   None Visit Diagnoses       Well woman exam with routine gynecological exam    -  Primary     Cervical cancer screening         Depression screening         Screening mammogram for breast cancer           1) Mammogram - recommend yearly screening mammogram.  Mammogram {Blank single:19197::Is up to date,Was ordered today}   2) STI screening  {Blank single:19197::was,was not}offered and {Blank single:19197::accepted,declined,therefore not obtained}  3) ASCCP guidelines and rational discussed.  Patient opts for {Blank single:19197::***,every 5 years,every 3 years,yearly,discontinue age >65,discontinue secondary to prior hysterectomy} screening interval  4) Contraception - the patient is currently using  {method:5051}.  She is {Blank single:19197::happy with her current form of contraception and plans to continue,interested in changing to ***,interested in starting Contraception: ***,not currently in need of contraception secondary to being sterile,attempting to conceive in the near future}  5) Colonoscopy -- Screening recommended starting at age 45 for  average risk individuals, age 72 for individuals deemed at increased risk (including African Americans) and recommended to continue until age 69.  For patient age 75-85 individualized approach is recommended.  Gold standard screening is via colonoscopy, Cologuard screening is an acceptable alternative for patient unwilling or unable to undergo colonoscopy.  Colorectal cancer screening for average?risk adults: 2018 guideline update from the American Cancer SocietyCA: A Cancer Journal for Clinicians: Nov 11, 2016   6) Routine healthcare maintenance including cholesterol, diabetes screening discussed {Blank single:19197::managed by PCP,Ordered today,To return fasting at a later date,Declines}  7) No follow-ups on file.   Slater Rains, CNM [facility] 06/01/2024 2:21 PM             [1]  Allergies Allergen Reactions   Other     Tingling with seafood   Metronidazole Rash

## 2024-06-01 NOTE — Patient Instructions (Incomplete)
 Preventive Care 27-41 Years Old, Female Preventive care refers to lifestyle choices and visits with your health care provider that can promote health and wellness. Preventive care visits are also called wellness exams. What can I expect for my preventive care visit? Counseling Your health care provider may ask you questions about your: Medical history, including: Past medical problems. Family medical history. Pregnancy history. Current health, including: Menstrual cycle. Method of birth control. Emotional well-being. Home life and relationship well-being. Sexual activity and sexual health. Lifestyle, including: Alcohol, nicotine or tobacco, and drug use. Access to firearms. Diet, exercise, and sleep habits. Work and work astronomer. Sunscreen use. Safety issues such as seatbelt and bike helmet use. Physical exam Your health care provider will check your: Height and weight. These may be used to calculate your BMI (body mass index). BMI is a measurement that tells if you are at a healthy weight. Waist circumference. This measures the distance around your waistline. This measurement also tells if you are at a healthy weight and may help predict your risk of certain diseases, such as type 2 diabetes and high blood pressure. Heart rate and blood pressure. Body temperature. Skin for abnormal spots. What immunizations do I need?  Vaccines are usually given at various ages, according to a schedule. Your health care provider will recommend vaccines for you based on your age, medical history, and lifestyle or other factors, such as travel or where you work. What tests do I need? Screening Your health care provider may recommend screening tests for certain conditions. This may include: Lipid and cholesterol levels. Diabetes screening. This is done by checking your blood sugar (glucose) after you have not eaten for a while (fasting). Pelvic exam and Pap test. Hepatitis B test. Hepatitis C  test. HIV (human immunodeficiency virus) test. STI (sexually transmitted infection) testing, if you are at risk. Lung cancer screening. Colorectal cancer screening. Mammogram. Talk with your health care provider about when you should start having regular mammograms. This may depend on whether you have a family history of breast cancer. BRCA-related cancer screening. This may be done if you have a family history of breast, ovarian, tubal, or peritoneal cancers. Bone density scan. This is done to screen for osteoporosis. Talk with your health care provider about your test results, treatment options, and if necessary, the need for more tests. Follow these instructions at home: Eating and drinking  Eat a diet that includes fresh fruits and vegetables, whole grains, lean protein, and low-fat dairy products. Take vitamin and mineral supplements as recommended by your health care provider. Do not drink alcohol if: Your health care provider tells you not to drink. You are pregnant, may be pregnant, or are planning to become pregnant. If you drink alcohol: Limit how much you have to 0-1 drink a day. Know how much alcohol is in your drink. In the [place] one drink equals one 12 oz bottle of beer (355 mL), one 5 oz glass of wine (148 mL), or one 1 oz glass of hard liquor (44 mL). Lifestyle Brush your teeth every morning and night with fluoride toothpaste. Floss one time each day. Exercise for at least 30 minutes 5 or more days each week. Do not use any products that contain nicotine or tobacco. These products include cigarettes, chewing tobacco, and vaping devices, such as e-cigarettes. If you need help quitting, ask your health care provider. Do not use drugs. If you are sexually active, practice safe sex. Use a condom or other form of protection to  prevent STIs. If you do not wish to become pregnant, use a form of birth control. If you plan to become pregnant, see your health care provider for a  prepregnancy visit. Take aspirin only as told by your health care provider. Make sure that you understand how much to take and what form to take. Work with your health care provider to find out whether it is safe and beneficial for you to take aspirin daily. Find healthy ways to manage stress, such as: Meditation, yoga, or listening to music. Journaling. Talking to a trusted person. Spending time with friends and family. Minimize exposure to UV radiation to reduce your risk of skin cancer. Safety Always wear your seat belt while driving or riding in a vehicle. Do not drive: If you have been drinking alcohol. Do not ride with someone who has been drinking. When you are tired or distracted. While texting. If you have been using any mind-altering substances or drugs. Wear a helmet and other protective equipment during sports activities. If you have firearms in your house, make sure you follow all gun safety procedures. Seek help if you have been physically or sexually abused. What's next? Visit your health care provider once a year for an annual wellness visit. Ask your health care provider how often you should have your eyes and teeth checked. Stay up to date on all vaccines. This information is not intended to replace advice given to you by your health care provider. Make sure you discuss any questions you have with your health care provider. Document Revised: 11/27/2020 Document Reviewed: 11/27/2020 Elsevier Patient Education  2024 [employer] How to Do a Breast Self-Exam Doing breast self-exams can help you stay healthy. They're one way to know what's normal for your breasts. They can help you catch a problem while it's still small and can be treated. You need to: Check your breasts often. Tell your doctor about any changes. You should do breast self-exams even if you have breast implants. What you need: A mirror. A well-lit room. A pillow or other soft object. How to do a  breast self-exam Look for changes  Take off all the clothes above your waist. Stand in front of a mirror in a room with good lighting. Put your hands down at your sides. Compare your breasts in the mirror. Look for difference between them, such as: Differences in shape. Differences in size. Wrinkles, dips, and bumps in one breast and not the other. Look at each breast for skin changes, such as: Redness. Scaly spots. Spots where your skin is thicker. Dimpling. Open sores. Look for changes in your nipples, such as: Fluid coming out of a nipple. Fluid around a nipple. Bleeding. Dimpling. Redness. A nipple that looks pushed in or that has changed position. Feel for changes Lie on your back. Feel each breast. To do this: Pick a breast to feel. Place a pillow under the shoulder closest to that breast. Put the arm closest to that breast behind your head. Feel the breast using the hand of your other arm. Use the pads of your three middle fingers to make small circles starting near the nipple. Use light, medium, and firm pressure. Keep making circles, moving down over the breast. Stop when you feel your ribs. Start making circles with your fingers again, this time going up until you reach your collarbone. Then, make circles out across your breast and into your armpit area. Squeeze your nipple. Check for fluid and lumps. Do these steps again  to check your other breast. Sit or stand in the tub or shower. With soapy water on your skin, feel each breast the same way you did when you were lying down. Write down what you find Writing down what you find can help you keep track of what you want to tell your doctor. Write down: What's normal for each breast. Any changes you find. Write down: The kind of change. If your breast feels tender or painful. Any lump you find. Write down its size and where it is. When you last had your period. General tips If you're breastfeeding, the best time  to check your breasts is after you feed your baby or after you use a breast pump. If you get a period, the best time to check your breasts is 5-7 days after your period ends. With time, you'll get more used to doing the self-exam. You'll also start to know if there are changes in your breasts. Contact a doctor if: You see a change in the shape or size of your breasts or nipples. You see a change in the skin of your breast or nipples. You have fluid coming from your nipples that isn't normal. You find a new lump or thick area. You have breast pain. You have any concerns about your breast health. This information is not intended to replace advice given to you by your health care provider. Make sure you discuss any questions you have with your health care provider. Document Revised: 08/11/2023 Document Reviewed: 08/11/2023 Elsevier Patient Education  2025 [employer]. Pap Test: What to Know Why am I having this test? A Pap test, also called a Pap smear, is a screening test to check for signs of: Infection. Cancer of the cervix. The cervix is the lowest part of the uterus. Precancerous changes. These are changes that may be a sign that cancer is developing. Females need this test regularly. In general, you should have a Pap test every 3 years until you reach menopause or you are 41 years old. If you are 47-72 years old you may choose to have their Pap test done at the same time as an human papillomavirus (HPV) test every 5 years instead of every 3 years. Your health care provider may recommend having Pap tests more or less often depending on your medical conditions and past Pap test results. What is being tested? Cervical cells are tested for signs of infection or abnormalities. What kind of sample is taken?  Your provider will collect a sample of cells from the surface of your cervix. This will be done using a small cotton swab, plastic spatula, or brush that is inserted into your vagina  using a tool called a speculum. This sample is often collected during a pelvic exam, when you are lying on your back on an exam table with your feet in footrests, called stirrups. In some cases, fluids (secretions) from the cervix or vagina may also be collected. How do I prepare for this test? Know where you are in your menstrual cycle. If you're menstruating on the day of the test, you may be asked to reschedule. You may need to reschedule if you have a known vaginal infection on the day of the test. Follow instructions from your provider about: Changing or stopping your regular medicines. Some medicines, such as vaginal medicines and tetracycline, can cause abnormal test results. Avoiding douching 2-3 days before or the day of the test. Tell a health care provider about: Any allergies you have.  All medicines you take. These include vitamins, herbs, eye drops, and creams. Any bleeding problems you have. Any surgeries you've had. Any medical problems you have. Whether you're pregnant or may be pregnant. How are the results reported? Your test results will be reported as either abnormal or normal. What do the results mean? A normal test result means that you do not have signs of cancer of the cervix. An abnormal result may mean that you have: Cancer. A Pap test by itself is not enough to diagnose cancer. You will have more tests done if cancer is suspected. Precancerous changes in your cervix. Inflammation of the cervix. A sexually transmitted infection (STI). A fungal infection. An infection from a parasite. Talk with your provider about what your results mean. More tests may be needed. Questions to ask your health care provider Ask your provider, or the department that is doing the test: When will my results be ready? How will I get my results? What are my treatment options? What other tests do I need? What are my next steps? This information is not intended to replace advice  given to you by your health care provider. Make sure you discuss any questions you have with your health care provider. Document Revised: 08/21/2023 Document Reviewed: 08/21/2023 Elsevier Patient Education  2025 [employer].

## 2024-06-02 ENCOUNTER — Other Ambulatory Visit (HOSPITAL_COMMUNITY)
Admission: RE | Admit: 2024-06-02 | Discharge: 2024-06-02 | Disposition: A | Discharge status: Home / Self Care | Source: Ambulatory Visit | Attending: Advanced Practice Midwife | Admitting: Advanced Practice Midwife

## 2024-06-02 ENCOUNTER — Ambulatory Visit: Admitting: Advanced Practice Midwife

## 2024-06-02 ENCOUNTER — Encounter: Payer: Self-pay | Admitting: Advanced Practice Midwife

## 2024-06-02 VITALS — BP 128/78 | HR 90 | Ht 65.0 in | Wt 172.0 lb

## 2024-06-02 DIAGNOSIS — Z13220 Encounter for screening for lipoid disorders: Secondary | ICD-10-CM

## 2024-06-02 DIAGNOSIS — Z01419 Encounter for gynecological examination (general) (routine) without abnormal findings: Secondary | ICD-10-CM | POA: Insufficient documentation

## 2024-06-02 DIAGNOSIS — Z124 Encounter for screening for malignant neoplasm of cervix: Secondary | ICD-10-CM

## 2024-06-02 DIAGNOSIS — Z23 Encounter for immunization: Secondary | ICD-10-CM

## 2024-06-02 DIAGNOSIS — Z1329 Encounter for screening for other suspected endocrine disorder: Secondary | ICD-10-CM

## 2024-06-02 DIAGNOSIS — Z1231 Encounter for screening mammogram for malignant neoplasm of breast: Secondary | ICD-10-CM

## 2024-06-02 DIAGNOSIS — Z131 Encounter for screening for diabetes mellitus: Secondary | ICD-10-CM

## 2024-06-02 DIAGNOSIS — Z1321 Encounter for screening for nutritional disorder: Secondary | ICD-10-CM

## 2024-06-02 DIAGNOSIS — Z1331 Encounter for screening for depression: Secondary | ICD-10-CM

## 2024-06-03 LAB — CBC WITH DIFFERENTIAL/PLATELET
Basophils Absolute: 0.1 x10E3/uL (ref 0.0–0.2)
Basos: 1 %
EOS (ABSOLUTE): 0.2 x10E3/uL (ref 0.0–0.4)
Eos: 4 %
Hematocrit: 42.1 % (ref 34.0–46.6)
Hemoglobin: 14 g/dL (ref 11.1–15.9)
Immature Grans (Abs): 0 x10E3/uL (ref 0.0–0.1)
Immature Granulocytes: 0 %
Lymphocytes Absolute: 1.6 x10E3/uL (ref 0.7–3.1)
Lymphs: 28 %
MCH: 30.6 pg (ref 26.6–33.0)
MCHC: 33.3 g/dL (ref 31.5–35.7)
MCV: 92 fL (ref 79–97)
Monocytes Absolute: 0.4 x10E3/uL (ref 0.1–0.9)
Monocytes: 7 %
Neutrophils Absolute: 3.6 x10E3/uL (ref 1.4–7.0)
Neutrophils: 60 %
Platelets: 217 x10E3/uL (ref 150–450)
RBC: 4.58 x10E6/uL (ref 3.77–5.28)
RDW: 12.3 % (ref 11.7–15.4)
WBC: 6 x10E3/uL (ref 3.4–10.8)

## 2024-06-03 LAB — LIPID PANEL WITH LDL/HDL RATIO
Cholesterol, Total: 156 mg/dL (ref 100–199)
HDL: 39 mg/dL — ABNORMAL LOW
LDL Chol Calc (NIH): 105 mg/dL — ABNORMAL HIGH (ref 0–99)
LDL/HDL Ratio: 2.7 ratio (ref 0.0–3.2)
Triglycerides: 57 mg/dL (ref 0–149)
VLDL Cholesterol Cal: 12 mg/dL (ref 5–40)

## 2024-06-03 LAB — COMPREHENSIVE METABOLIC PANEL WITH GFR
ALT: 7 IU/L (ref 0–32)
AST: 10 IU/L (ref 0–40)
Albumin: 4.4 g/dL (ref 3.9–4.9)
Alkaline Phosphatase: 56 IU/L (ref 41–116)
BUN/Creatinine Ratio: 16 (ref 9–23)
BUN: 13 mg/dL (ref 6–24)
Bilirubin Total: 0.7 mg/dL (ref 0.0–1.2)
CO2: 21 mmol/L (ref 20–29)
Calcium: 9.1 mg/dL (ref 8.7–10.2)
Chloride: 107 mmol/L — ABNORMAL HIGH (ref 96–106)
Creatinine, Ser: 0.8 mg/dL (ref 0.57–1.00)
Globulin, Total: 2 g/dL (ref 1.5–4.5)
Glucose: 85 mg/dL (ref 70–99)
Potassium: 4.2 mmol/L (ref 3.5–5.2)
Sodium: 142 mmol/L (ref 134–144)
Total Protein: 6.4 g/dL (ref 6.0–8.5)
eGFR: 95 mL/min/1.73

## 2024-06-03 LAB — TSH: TSH: 1.44 u[IU]/mL (ref 0.450–4.500)

## 2024-06-03 LAB — VITAMIN D 25 HYDROXY (VIT D DEFICIENCY, FRACTURES): Vit D, 25-Hydroxy: 11.4 ng/mL — ABNORMAL LOW (ref 30.0–100.0)

## 2024-06-03 LAB — HGB A1C W/O EAG: Hgb A1c MFr Bld: 5.2 % (ref 4.8–5.6)

## 2024-06-04 ENCOUNTER — Ambulatory Visit: Payer: Self-pay | Admitting: Advanced Practice Midwife

## 2024-06-06 LAB — CYTOLOGY - PAP
Comment: NEGATIVE
Diagnosis: NEGATIVE
High risk HPV: NEGATIVE

## 2024-07-07 ENCOUNTER — Ambulatory Visit
Admission: RE | Admit: 2024-07-07 | Discharge: 2024-07-07 | Disposition: A | Source: Ambulatory Visit | Attending: Advanced Practice Midwife | Admitting: Advanced Practice Midwife

## 2024-07-07 DIAGNOSIS — Z1231 Encounter for screening mammogram for malignant neoplasm of breast: Secondary | ICD-10-CM | POA: Diagnosis present

## 2024-07-07 DIAGNOSIS — Z01419 Encounter for gynecological examination (general) (routine) without abnormal findings: Secondary | ICD-10-CM

## 2024-07-10 ENCOUNTER — Ambulatory Visit: Admitting: Obstetrics & Gynecology

## 2024-08-14 ENCOUNTER — Ambulatory Visit: Admitting: Obstetrics & Gynecology
